# Patient Record
Sex: Male | Born: 1940 | Race: Black or African American | Hispanic: No | Marital: Single | Smoking: Never smoker
Health system: Southern US, Community
[De-identification: ages and names within clinical notes are randomized; demographics above are authoritative.]

## PROBLEM LIST (undated history)

## (undated) DIAGNOSIS — J209 Acute bronchitis, unspecified: Secondary | ICD-10-CM

## (undated) DIAGNOSIS — F411 Generalized anxiety disorder: Secondary | ICD-10-CM

## (undated) DIAGNOSIS — K219 Gastro-esophageal reflux disease without esophagitis: Secondary | ICD-10-CM

## (undated) DIAGNOSIS — M545 Low back pain, unspecified: Secondary | ICD-10-CM

## (undated) DIAGNOSIS — D126 Benign neoplasm of colon, unspecified: Secondary | ICD-10-CM

## (undated) DIAGNOSIS — I491 Atrial premature depolarization: Secondary | ICD-10-CM

## (undated) DIAGNOSIS — Z8669 Personal history of other diseases of the nervous system and sense organs: Secondary | ICD-10-CM

## (undated) DIAGNOSIS — N281 Cyst of kidney, acquired: Secondary | ICD-10-CM

## (undated) DIAGNOSIS — G4733 Obstructive sleep apnea (adult) (pediatric): Secondary | ICD-10-CM

## (undated) DIAGNOSIS — C61 Malignant neoplasm of prostate: Secondary | ICD-10-CM

## (undated) DIAGNOSIS — R413 Other amnesia: Secondary | ICD-10-CM

## (undated) DIAGNOSIS — E785 Hyperlipidemia, unspecified: Secondary | ICD-10-CM

## (undated) DIAGNOSIS — K5730 Diverticulosis of large intestine without perforation or abscess without bleeding: Secondary | ICD-10-CM

## (undated) DIAGNOSIS — K589 Irritable bowel syndrome without diarrhea: Secondary | ICD-10-CM

## (undated) DIAGNOSIS — M1990 Unspecified osteoarthritis, unspecified site: Secondary | ICD-10-CM

## (undated) HISTORY — DX: Cyst of kidney, acquired: N28.1

## (undated) HISTORY — DX: Benign neoplasm of colon, unspecified: D12.6

## (undated) HISTORY — DX: Diverticulosis of large intestine without perforation or abscess without bleeding: K57.30

## (undated) HISTORY — DX: Personal history of other diseases of the nervous system and sense organs: Z86.69

## (undated) HISTORY — DX: Acute bronchitis, unspecified: J20.9

## (undated) HISTORY — DX: Irritable bowel syndrome, unspecified: K58.9

## (undated) HISTORY — DX: Unspecified osteoarthritis, unspecified site: M19.90

## (undated) HISTORY — DX: Malignant neoplasm of prostate: C61

## (undated) HISTORY — DX: Obstructive sleep apnea (adult) (pediatric): G47.33

## (undated) HISTORY — DX: Hyperlipidemia, unspecified: E78.5

## (undated) HISTORY — DX: Low back pain: M54.5

## (undated) HISTORY — DX: Generalized anxiety disorder: F41.1

## (undated) HISTORY — DX: Other amnesia: R41.3

## (undated) HISTORY — DX: Low back pain, unspecified: M54.50

## (undated) HISTORY — DX: Atrial premature depolarization: I49.1

## (undated) HISTORY — PX: OTHER SURGICAL HISTORY: SHX169

## (undated) HISTORY — DX: Gastro-esophageal reflux disease without esophagitis: K21.9

---

## 1983-07-12 ENCOUNTER — Encounter (INDEPENDENT_AMBULATORY_CARE_PROVIDER_SITE_OTHER): Payer: Self-pay | Admitting: *Deleted

## 1984-04-17 HISTORY — PX: INNER EAR SURGERY: SHX679

## 1992-10-29 ENCOUNTER — Encounter (INDEPENDENT_AMBULATORY_CARE_PROVIDER_SITE_OTHER): Payer: Self-pay | Admitting: *Deleted

## 1998-04-08 ENCOUNTER — Encounter (INDEPENDENT_AMBULATORY_CARE_PROVIDER_SITE_OTHER): Payer: Self-pay | Admitting: *Deleted

## 1998-10-26 ENCOUNTER — Ambulatory Visit: Admission: RE | Admit: 1998-10-26 | Discharge: 1998-10-26 | Payer: Self-pay | Admitting: Otolaryngology

## 1999-04-18 HISTORY — PX: OTHER SURGICAL HISTORY: SHX169

## 2000-04-30 ENCOUNTER — Observation Stay (HOSPITAL_COMMUNITY): Admission: RE | Admit: 2000-04-30 | Discharge: 2000-05-01 | Payer: Self-pay | Admitting: Orthopedic Surgery

## 2000-07-03 ENCOUNTER — Ambulatory Visit (HOSPITAL_COMMUNITY): Admission: RE | Admit: 2000-07-03 | Discharge: 2000-07-03 | Payer: Self-pay | Admitting: Gastroenterology

## 2000-07-03 ENCOUNTER — Encounter: Payer: Self-pay | Admitting: Gastroenterology

## 2001-05-31 ENCOUNTER — Ambulatory Visit (HOSPITAL_BASED_OUTPATIENT_CLINIC_OR_DEPARTMENT_OTHER): Admission: RE | Admit: 2001-05-31 | Discharge: 2001-05-31 | Payer: Self-pay | Admitting: Pulmonary Disease

## 2001-09-06 ENCOUNTER — Encounter: Payer: Self-pay | Admitting: Pulmonary Disease

## 2002-07-28 ENCOUNTER — Encounter: Payer: Self-pay | Admitting: Gastroenterology

## 2002-08-14 ENCOUNTER — Encounter (INDEPENDENT_AMBULATORY_CARE_PROVIDER_SITE_OTHER): Payer: Self-pay | Admitting: *Deleted

## 2004-07-19 ENCOUNTER — Ambulatory Visit: Payer: Self-pay | Admitting: Pulmonary Disease

## 2005-01-30 ENCOUNTER — Ambulatory Visit: Payer: Self-pay | Admitting: Pulmonary Disease

## 2005-02-01 ENCOUNTER — Ambulatory Visit: Payer: Self-pay | Admitting: Cardiovascular Disease

## 2005-02-23 ENCOUNTER — Ambulatory Visit: Payer: Self-pay | Admitting: Pulmonary Disease

## 2005-08-01 ENCOUNTER — Ambulatory Visit: Payer: Self-pay | Admitting: Pulmonary Disease

## 2005-08-17 ENCOUNTER — Ambulatory Visit: Payer: Self-pay | Admitting: Pulmonary Disease

## 2005-12-06 ENCOUNTER — Ambulatory Visit: Payer: Self-pay | Admitting: Pulmonary Disease

## 2006-03-14 ENCOUNTER — Ambulatory Visit: Payer: Self-pay | Admitting: Pulmonary Disease

## 2006-03-19 ENCOUNTER — Ambulatory Visit: Payer: Self-pay | Admitting: Pulmonary Disease

## 2006-03-19 LAB — CONVERTED CEMR LAB
ALT: 25 units/L (ref 0–40)
AST: 29 units/L (ref 0–37)
Albumin: 4.4 g/dL (ref 3.5–5.2)
Alkaline Phosphatase: 76 units/L (ref 39–117)
BUN: 10 mg/dL (ref 6–23)
Basophils Absolute: 0 10*3/uL (ref 0.0–0.1)
Basophils Relative: 0.4 % (ref 0.0–1.0)
CO2: 32 meq/L (ref 19–32)
Calcium: 9 mg/dL (ref 8.4–10.5)
Chloride: 104 meq/L (ref 96–112)
Chol/HDL Ratio, serum: 5.1
Cholesterol: 214 mg/dL (ref 0–200)
Creatinine, Ser: 1.1 mg/dL (ref 0.4–1.5)
Eosinophil percent: 2.7 % (ref 0.0–5.0)
GFR calc non Af Amer: 71 mL/min
Glomerular Filtration Rate, Af Am: 86 mL/min/{1.73_m2}
Glucose, Bld: 95 mg/dL (ref 70–99)
HCT: 47.7 % (ref 39.0–52.0)
HDL: 42.3 mg/dL (ref >39.0)
Hemoglobin: 16 g/dL (ref 13.0–17.0)
LDL DIRECT: 169.6 mg/dL
Lymphocytes Relative: 34.2 % (ref 12.0–46.0)
MCHC: 33.5 g/dL (ref 30.0–36.0)
MCV: 89.6 fL (ref 78.0–100.0)
Monocytes Absolute: 0.7 10*3/uL (ref 0.2–0.7)
Monocytes Relative: 8.3 % (ref 3.0–11.0)
Neutro Abs: 5 10*3/uL (ref 1.4–7.7)
Neutrophils Relative %: 54.4 % (ref 43.0–77.0)
PSA: 2.17 ng/mL (ref 0.10–4.00)
Platelets: 249 10*3/uL (ref 150–400)
Potassium: 3.5 meq/L (ref 3.5–5.1)
RBC: 5.33 M/uL (ref 4.22–5.81)
RDW: 13.1 % (ref 11.5–14.6)
Sodium: 143 meq/L (ref 135–145)
TSH: 1.22 microintl units/mL (ref 0.35–5.50)
Total Bilirubin: 1 mg/dL (ref 0.3–1.2)
Total Protein: 7.3 g/dL (ref 6.0–8.3)
Triglyceride fasting, serum: 72 mg/dL (ref 0–149)
VLDL: 14 mg/dL (ref 0–40)
WBC: 9 10*3/uL (ref 4.5–10.5)

## 2006-06-25 ENCOUNTER — Ambulatory Visit: Payer: Self-pay | Admitting: Pulmonary Disease

## 2006-10-01 ENCOUNTER — Ambulatory Visit: Payer: Self-pay | Admitting: Pulmonary Disease

## 2007-01-29 ENCOUNTER — Ambulatory Visit: Payer: Self-pay | Admitting: Pulmonary Disease

## 2007-01-30 DIAGNOSIS — K5730 Diverticulosis of large intestine without perforation or abscess without bleeding: Secondary | ICD-10-CM | POA: Insufficient documentation

## 2007-01-30 DIAGNOSIS — K219 Gastro-esophageal reflux disease without esophagitis: Secondary | ICD-10-CM | POA: Insufficient documentation

## 2007-01-30 DIAGNOSIS — F411 Generalized anxiety disorder: Secondary | ICD-10-CM | POA: Insufficient documentation

## 2007-01-30 DIAGNOSIS — E785 Hyperlipidemia, unspecified: Secondary | ICD-10-CM | POA: Insufficient documentation

## 2007-01-30 DIAGNOSIS — G4733 Obstructive sleep apnea (adult) (pediatric): Secondary | ICD-10-CM

## 2007-01-30 DIAGNOSIS — I491 Atrial premature depolarization: Secondary | ICD-10-CM

## 2007-01-30 DIAGNOSIS — Z8669 Personal history of other diseases of the nervous system and sense organs: Secondary | ICD-10-CM | POA: Insufficient documentation

## 2007-01-30 DIAGNOSIS — J209 Acute bronchitis, unspecified: Secondary | ICD-10-CM

## 2007-01-30 DIAGNOSIS — M545 Low back pain: Secondary | ICD-10-CM

## 2007-01-30 DIAGNOSIS — K589 Irritable bowel syndrome without diarrhea: Secondary | ICD-10-CM

## 2007-02-20 ENCOUNTER — Ambulatory Visit: Payer: Self-pay | Admitting: Cardiology

## 2007-03-21 ENCOUNTER — Encounter: Payer: Self-pay | Admitting: Pulmonary Disease

## 2007-03-21 DIAGNOSIS — M1990 Unspecified osteoarthritis, unspecified site: Secondary | ICD-10-CM | POA: Insufficient documentation

## 2007-03-21 DIAGNOSIS — N281 Cyst of kidney, acquired: Secondary | ICD-10-CM | POA: Insufficient documentation

## 2007-04-03 ENCOUNTER — Telehealth (INDEPENDENT_AMBULATORY_CARE_PROVIDER_SITE_OTHER): Payer: Self-pay | Admitting: *Deleted

## 2007-05-17 ENCOUNTER — Encounter: Payer: Self-pay | Admitting: Pulmonary Disease

## 2007-05-23 ENCOUNTER — Emergency Department (HOSPITAL_COMMUNITY): Admission: EM | Admit: 2007-05-23 | Discharge: 2007-05-23 | Payer: Self-pay | Admitting: Emergency Medicine

## 2007-11-11 ENCOUNTER — Ambulatory Visit: Payer: Self-pay | Admitting: Pulmonary Disease

## 2007-11-13 DIAGNOSIS — R413 Other amnesia: Secondary | ICD-10-CM

## 2007-11-13 LAB — CONVERTED CEMR LAB
ALT: 21 units/L (ref 0–53)
AST: 24 units/L (ref 0–37)
Albumin: 4.3 g/dL (ref 3.5–5.2)
Alkaline Phosphatase: 70 units/L (ref 39–117)
BUN: 15 mg/dL (ref 6–23)
Bacteria, UA: NEGATIVE
Basophils Absolute: 0 10*3/uL (ref 0.0–0.1)
Basophils Relative: 0.2 % (ref 0.0–3.0)
Bilirubin Urine: NEGATIVE
Bilirubin, Direct: 0.1 mg/dL (ref 0.0–0.3)
CO2: 32 meq/L (ref 19–32)
Calcium: 9 mg/dL (ref 8.4–10.5)
Chloride: 106 meq/L (ref 96–112)
Cholesterol: 192 mg/dL (ref 0–200)
Creatinine, Ser: 1 mg/dL (ref 0.4–1.5)
Crystals: NEGATIVE
Eosinophils Absolute: 0.1 10*3/uL (ref 0.0–0.7)
Eosinophils Relative: 1.5 % (ref 0.0–5.0)
GFR calc Af Amer: 96 mL/min
GFR calc non Af Amer: 79 mL/min
Glucose, Bld: 88 mg/dL (ref 70–99)
HCT: 44.7 % (ref 39.0–52.0)
HDL: 36.1 mg/dL — ABNORMAL LOW (ref >39.0)
Hemoglobin, Urine: NEGATIVE
Hemoglobin: 15.7 g/dL (ref 13.0–17.0)
Ketones, ur: NEGATIVE mg/dL
LDL Cholesterol: 134 mg/dL — ABNORMAL HIGH (ref 0–99)
Leukocytes, UA: NEGATIVE
Lymphocytes Relative: 35.8 % (ref 12.0–46.0)
MCHC: 35.1 g/dL (ref 30.0–36.0)
MCV: 90.5 fL (ref 78.0–100.0)
Monocytes Absolute: 0.7 10*3/uL (ref 0.1–1.0)
Monocytes Relative: 9.1 % (ref 3.0–12.0)
Mucus, UA: NEGATIVE
Neutro Abs: 4.2 10*3/uL (ref 1.4–7.7)
Neutrophils Relative %: 53.4 % (ref 43.0–77.0)
Nitrite: NEGATIVE
PSA: 2.59 ng/mL (ref 0.10–4.00)
Platelets: 235 10*3/uL (ref 150–400)
Potassium: 3.6 meq/L (ref 3.5–5.1)
RBC: 4.94 M/uL (ref 4.22–5.81)
RDW: 13 % (ref 11.5–14.6)
Sodium: 144 meq/L (ref 135–145)
Specific Gravity, Urine: 1.02 (ref 1.000–1.03)
TSH: 1.34 microintl units/mL (ref 0.35–5.50)
Total Bilirubin: 1 mg/dL (ref 0.3–1.2)
Total CHOL/HDL Ratio: 5.3
Total Protein, Urine: NEGATIVE mg/dL
Total Protein: 7.2 g/dL (ref 6.0–8.3)
Triglycerides: 110 mg/dL (ref 0–149)
Urine Glucose: NEGATIVE mg/dL
Urobilinogen, UA: 1 (ref 0.0–1.0)
VLDL: 22 mg/dL (ref 0–40)
WBC: 7.8 10*3/uL (ref 4.5–10.5)
pH: 6.5 (ref 5.0–8.0)

## 2008-01-31 ENCOUNTER — Telehealth: Payer: Self-pay | Admitting: Pulmonary Disease

## 2008-01-31 DIAGNOSIS — R079 Chest pain, unspecified: Secondary | ICD-10-CM | POA: Insufficient documentation

## 2008-02-04 ENCOUNTER — Encounter: Payer: Self-pay | Admitting: Pulmonary Disease

## 2008-02-10 ENCOUNTER — Encounter: Payer: Self-pay | Admitting: Pulmonary Disease

## 2008-02-10 ENCOUNTER — Ambulatory Visit: Payer: Self-pay

## 2008-04-06 ENCOUNTER — Telehealth (INDEPENDENT_AMBULATORY_CARE_PROVIDER_SITE_OTHER): Payer: Self-pay | Admitting: *Deleted

## 2008-04-08 ENCOUNTER — Ambulatory Visit: Payer: Self-pay | Admitting: Pulmonary Disease

## 2008-04-08 DIAGNOSIS — M79609 Pain in unspecified limb: Secondary | ICD-10-CM

## 2008-04-14 ENCOUNTER — Ambulatory Visit: Payer: Self-pay

## 2008-04-14 ENCOUNTER — Encounter: Payer: Self-pay | Admitting: Pulmonary Disease

## 2008-04-16 ENCOUNTER — Encounter: Payer: Self-pay | Admitting: Pulmonary Disease

## 2008-04-16 ENCOUNTER — Ambulatory Visit: Payer: Self-pay

## 2008-11-27 ENCOUNTER — Ambulatory Visit: Payer: Self-pay | Admitting: Pulmonary Disease

## 2008-11-27 DIAGNOSIS — L723 Sebaceous cyst: Secondary | ICD-10-CM

## 2008-11-30 ENCOUNTER — Ambulatory Visit: Payer: Self-pay | Admitting: Adult Health

## 2008-12-01 LAB — CONVERTED CEMR LAB
ALT: 28 units/L (ref 0–53)
AST: 30 units/L (ref 0–37)
Albumin: 4.4 g/dL (ref 3.5–5.2)
Alkaline Phosphatase: 82 units/L (ref 39–117)
BUN: 12 mg/dL (ref 6–23)
Basophils Absolute: 0 10*3/uL (ref 0.0–0.1)
Basophils Relative: 0.3 % (ref 0.0–3.0)
Bilirubin, Direct: 0.1 mg/dL (ref 0.0–0.3)
CO2: 33 meq/L — ABNORMAL HIGH (ref 19–32)
Calcium: 9.2 mg/dL (ref 8.4–10.5)
Chloride: 107 meq/L (ref 96–112)
Cholesterol: 224 mg/dL — ABNORMAL HIGH (ref 0–200)
Creatinine, Ser: 1 mg/dL (ref 0.4–1.5)
Direct LDL: 160.7 mg/dL
Eosinophils Absolute: 0.2 10*3/uL (ref 0.0–0.7)
Eosinophils Relative: 2.3 % (ref 0.0–5.0)
GFR calc non Af Amer: 95.65 mL/min (ref >60)
Glucose, Bld: 95 mg/dL (ref 70–99)
HCT: 47.2 % (ref 39.0–52.0)
HDL: 42.7 mg/dL (ref >39.00)
Hemoglobin: 15.9 g/dL (ref 13.0–17.0)
Lymphocytes Relative: 37.4 % (ref 12.0–46.0)
Lymphs Abs: 3 10*3/uL (ref 0.7–4.0)
MCHC: 33.8 g/dL (ref 30.0–36.0)
MCV: 89.6 fL (ref 78.0–100.0)
Monocytes Absolute: 0.8 10*3/uL (ref 0.1–1.0)
Monocytes Relative: 9.3 % (ref 3.0–12.0)
Neutro Abs: 4.1 10*3/uL (ref 1.4–7.7)
Neutrophils Relative %: 50.7 % (ref 43.0–77.0)
PSA: 3.05 ng/mL (ref 0.10–4.00)
Platelets: 239 10*3/uL (ref 150.0–400.0)
Potassium: 3.8 meq/L (ref 3.5–5.1)
RBC: 5.26 M/uL (ref 4.22–5.81)
RDW: 13.1 % (ref 11.5–14.6)
Sodium: 142 meq/L (ref 135–145)
TSH: 0.72 microintl units/mL (ref 0.35–5.50)
Total Bilirubin: 1.1 mg/dL (ref 0.3–1.2)
Total CHOL/HDL Ratio: 5
Total Protein: 7.9 g/dL (ref 6.0–8.3)
Triglycerides: 149 mg/dL (ref 0.0–149.0)
VLDL: 29.8 mg/dL (ref 0.0–40.0)
WBC: 8.1 10*3/uL (ref 4.5–10.5)

## 2009-01-05 ENCOUNTER — Encounter: Payer: Self-pay | Admitting: Pulmonary Disease

## 2009-01-22 ENCOUNTER — Encounter: Payer: Self-pay | Admitting: Pulmonary Disease

## 2009-02-10 ENCOUNTER — Ambulatory Visit: Payer: Self-pay | Admitting: Pulmonary Disease

## 2009-02-10 ENCOUNTER — Telehealth: Payer: Self-pay | Admitting: Pulmonary Disease

## 2009-02-12 ENCOUNTER — Telehealth: Payer: Self-pay | Admitting: Pulmonary Disease

## 2009-02-12 LAB — CONVERTED CEMR LAB
Cholesterol: 224 mg/dL — ABNORMAL HIGH (ref 0–200)
Direct LDL: 176.1 mg/dL
HDL: 39.2 mg/dL (ref >39.00)
Total CHOL/HDL Ratio: 6
Triglycerides: 98 mg/dL (ref 0.0–149.0)
VLDL: 19.6 mg/dL (ref 0.0–40.0)

## 2009-03-22 ENCOUNTER — Encounter: Payer: Self-pay | Admitting: Pulmonary Disease

## 2009-04-23 ENCOUNTER — Encounter: Payer: Self-pay | Admitting: Pulmonary Disease

## 2009-05-13 ENCOUNTER — Encounter: Payer: Self-pay | Admitting: Pulmonary Disease

## 2009-05-26 ENCOUNTER — Ambulatory Visit: Admission: RE | Admit: 2009-05-26 | Discharge: 2009-08-24 | Payer: Self-pay | Admitting: Radiation Oncology

## 2009-05-27 ENCOUNTER — Encounter: Payer: Self-pay | Admitting: Pulmonary Disease

## 2009-06-01 ENCOUNTER — Encounter: Payer: Self-pay | Admitting: Pulmonary Disease

## 2009-06-24 ENCOUNTER — Encounter (INDEPENDENT_AMBULATORY_CARE_PROVIDER_SITE_OTHER): Payer: Self-pay | Admitting: *Deleted

## 2009-08-02 ENCOUNTER — Telehealth: Payer: Self-pay | Admitting: Gastroenterology

## 2009-08-03 ENCOUNTER — Encounter (INDEPENDENT_AMBULATORY_CARE_PROVIDER_SITE_OTHER): Payer: Self-pay | Admitting: *Deleted

## 2009-08-10 ENCOUNTER — Ambulatory Visit: Payer: Self-pay | Admitting: Pulmonary Disease

## 2009-08-10 DIAGNOSIS — C61 Malignant neoplasm of prostate: Secondary | ICD-10-CM

## 2009-08-12 ENCOUNTER — Telehealth (INDEPENDENT_AMBULATORY_CARE_PROVIDER_SITE_OTHER): Payer: Self-pay | Admitting: *Deleted

## 2009-08-14 LAB — CONVERTED CEMR LAB
Alkaline Phosphatase: 81 units/L (ref 39–117)
Basophils Relative: 0.2 % (ref 0.0–3.0)
Bilirubin, Direct: 0.1 mg/dL (ref 0.0–0.3)
CO2: 32 meq/L (ref 19–32)
Calcium: 9.6 mg/dL (ref 8.4–10.5)
Creatinine, Ser: 1.1 mg/dL (ref 0.4–1.5)
Eosinophils Absolute: 0.1 10*3/uL (ref 0.0–0.7)
GFR calc non Af Amer: 85.51 mL/min (ref >60)
HCT: 44.4 % (ref 39.0–52.0)
HDL: 41.8 mg/dL (ref >39.00)
Hemoglobin: 15.4 g/dL (ref 13.0–17.0)
Lymphocytes Relative: 20.1 % (ref 12.0–46.0)
Lymphs Abs: 1.4 10*3/uL (ref 0.7–4.0)
MCHC: 34.6 g/dL (ref 30.0–36.0)
Neutro Abs: 4.9 10*3/uL (ref 1.4–7.7)
RBC: 4.95 M/uL (ref 4.22–5.81)
Sodium: 146 meq/L — ABNORMAL HIGH (ref 135–145)
TSH: 0.79 microintl units/mL (ref 0.35–5.50)
Total Bilirubin: 1 mg/dL (ref 0.3–1.2)
Total CHOL/HDL Ratio: 5
Total Protein: 7.3 g/dL (ref 6.0–8.3)
VLDL: 20.8 mg/dL (ref 0.0–40.0)

## 2009-08-24 ENCOUNTER — Ambulatory Visit: Admission: RE | Admit: 2009-08-24 | Discharge: 2009-09-22 | Payer: Self-pay | Admitting: Radiation Oncology

## 2009-09-22 ENCOUNTER — Encounter (INDEPENDENT_AMBULATORY_CARE_PROVIDER_SITE_OTHER): Payer: Self-pay | Admitting: *Deleted

## 2009-09-22 ENCOUNTER — Encounter: Payer: Self-pay | Admitting: Pulmonary Disease

## 2009-10-12 ENCOUNTER — Encounter (INDEPENDENT_AMBULATORY_CARE_PROVIDER_SITE_OTHER): Payer: Self-pay | Admitting: *Deleted

## 2009-10-13 ENCOUNTER — Ambulatory Visit: Payer: Self-pay | Admitting: Gastroenterology

## 2009-10-28 ENCOUNTER — Ambulatory Visit: Payer: Self-pay | Admitting: Gastroenterology

## 2009-11-01 ENCOUNTER — Encounter: Payer: Self-pay | Admitting: Gastroenterology

## 2009-11-02 ENCOUNTER — Encounter: Payer: Self-pay | Admitting: Pulmonary Disease

## 2009-12-01 ENCOUNTER — Encounter: Payer: Self-pay | Admitting: Pulmonary Disease

## 2010-02-07 ENCOUNTER — Ambulatory Visit: Payer: Self-pay | Admitting: Pulmonary Disease

## 2010-02-07 DIAGNOSIS — D126 Benign neoplasm of colon, unspecified: Secondary | ICD-10-CM

## 2010-02-11 ENCOUNTER — Ambulatory Visit: Payer: Self-pay | Admitting: Pulmonary Disease

## 2010-02-18 LAB — CONVERTED CEMR LAB
Hgb A1c MFr Bld: 5.7 % (ref 4.6–6.5)
Ketones, ur: NEGATIVE mg/dL
Urine Glucose: NEGATIVE mg/dL
Urobilinogen, UA: 1 (ref 0.0–1.0)

## 2010-03-01 ENCOUNTER — Encounter: Payer: Self-pay | Admitting: Pulmonary Disease

## 2010-03-07 LAB — CONVERTED CEMR LAB
Alkaline Phosphatase: 82 units/L (ref 39–117)
Bilirubin, Direct: 0.2 mg/dL (ref 0.0–0.3)
LDL Cholesterol: 78 mg/dL (ref 0–99)
Total Bilirubin: 1.4 mg/dL — ABNORMAL HIGH (ref 0.3–1.2)
Total CHOL/HDL Ratio: 3

## 2010-04-06 ENCOUNTER — Telehealth (INDEPENDENT_AMBULATORY_CARE_PROVIDER_SITE_OTHER): Payer: Self-pay | Admitting: *Deleted

## 2010-04-07 ENCOUNTER — Ambulatory Visit: Payer: Self-pay | Admitting: Adult Health

## 2010-04-07 DIAGNOSIS — R42 Dizziness and giddiness: Secondary | ICD-10-CM

## 2010-04-07 LAB — CONVERTED CEMR LAB
ALT: 33 units/L (ref 0–53)
Albumin: 4.3 g/dL (ref 3.5–5.2)
Alkaline Phosphatase: 80 units/L (ref 39–117)
Bilirubin, Direct: 0.1 mg/dL (ref 0.0–0.3)
CO2: 29 meq/L (ref 19–32)
Calcium: 9.2 mg/dL (ref 8.4–10.5)
Chloride: 102 meq/L (ref 96–112)
Glucose, Bld: 74 mg/dL (ref 70–99)
Sodium: 139 meq/L (ref 135–145)
Total Protein: 7 g/dL (ref 6.0–8.3)

## 2010-05-13 ENCOUNTER — Encounter: Payer: Self-pay | Admitting: Pulmonary Disease

## 2010-05-17 NOTE — Letter (Signed)
[facility]   Imported By: Sherian Rein 11/29/2009 11:54:54  _____________________________________________________________________  External Attachment:    Type:   Image     Comment:   External Document

## 2010-05-17 NOTE — Procedures (Signed)
Summary: EGD   EGD  Procedure date:  08/14/2002  Findings:      Findings: Normal  Location: [facility]   Patient Name: Brett Terrell, Brett Terrell MRN:  Procedure Procedures: Panendoscopy (EGD) CPT: 43235.  Personnel: Endoscopist: Ulyess Mort, MD.  Exam Location: Exam performed in Outpatient Clinic. Outpatient  Patient Consent: Procedure, Alternatives, Risks and Benefits discussed, consent obtained, from patient. Consent was obtained by the RN.  Indications Symptoms: Abdominal pain, location: LUQ.  History  Pre-Exam Physical: Performed Aug 14, 2002  Cardio-pulmonary exam, HEENT exam, Abdominal exam, Extremity exam, Mental status exam WNL.  Exam Exam Info: Maximum depth of insertion Duodenum, intended Duodenum. Vocal cords visualized. Gastric retroflexion was not performed. Images were not taken. ASA Classification: II. Tolerance: good.  Sedation Meds: Patient assessed and found to be appropriate for moderate (conscious) sedation. Fentanyl 50 mcg. given IV. Versed 7 mg. given IV. Cetacaine Spray 1 sprays given aerosolized.  Monitoring: BP and pulse monitoring done. Oximetry used. Supplemental O2 given  Findings - Normal: Proximal Esophagus to Distal Esophagus.  - Normal: Pyloric Sphincter to Jejunum.  - MUCOSAL ABNORMALITY: Body to Antrum. Granular mucosa.   Assessment Normal examination.  Events  Unplanned Intervention: No unplanned interventions were required.  Unplanned Events: There were no complications. Plans Medication(s): Continue current medications.  Patient Education: Patient given standard instructions for: Mucosal Abnormality. a normal exam. minn.gastritis.  Disposition: After procedure patient sent to recovery. After recovery patient sent home.  This report was created from the original endoscopy report, which was reviewed and signed by the above listed endoscopist.    cc: Lonzo Cloud. Kriste Basque, MD

## 2010-05-17 NOTE — Letter (Signed)
Summary: Previsit letter  [facility]  [address] [place], [place] [address]   Phone: [phone]  Fax: [phone]       09/22/2009 MRN: [id number]  [facility] [address] [place], [place]  [address]  Dear Mr. Creekmore,  Welcome to the Gastroenterology Division at [facility].    You are scheduled to see a nurse for your pre-procedure visit on 10/13/2009 at 10:30AM on the 3rd floor at [facility], [address]  We ask that you try to arrive at our office 15 minutes prior to your appointment time to allow for check-in.  Your nurse visit will consist of discussing your medical and surgical history, your immediate family medical history, and your medications.    Please bring a complete list of all your medications or, if you prefer, bring the medication bottles and we will list them.  We will need to be aware of both prescribed and over the counter drugs.  We will need to know exact dosage information as well.  If you are on blood thinners (Coumadin, Plavix, Aggrenox, Ticlid, etc.) please call our office today/prior to your appointment, as we need to consult with your physician about holding your medication.   Please be prepared to read and sign documents such as consent forms, a financial agreement, and acknowledgement forms.  If necessary, and with your consent, a friend or relative is welcome to sit-in on the nurse visit with you.  Please bring your insurance card so that we may make a copy of it.  If your insurance requires a referral to see a specialist, please bring your referral form from your primary care physician.  No co-pay is required for this nurse visit.     If you cannot keep your appointment, please call [phone] to cancel or reschedule prior to your appointment date.  This allows [place] the opportunity to schedule an appointment for another patient in need of care.    Thank you for choosing [facility] for your medical  needs.  We appreciate the opportunity to care for you.  Please visit [place] at our website  to learn more about our practice.                     Sincerely.                                                                                                                   The Gastroenterology Division

## 2010-05-17 NOTE — Procedures (Signed)
Summary: [facility]  [facility]   Imported By: Sherian Rein 08/25/2009 14:59:44  _____________________________________________________________________  External Attachment:    Type:   Image     Comment:   External Document

## 2010-05-17 NOTE — Letter (Signed)
Summary: Colonoscopy Letter  [facility]  [address] [place], [place] [address]   Phone: [phone]  Fax: [phone]      June 24, 2009 MRN: [id number]   [facility] [address] [place], [place]  [address]   Dear Mr. Bywater,   According to your medical record, it is time for you to schedule a Colonoscopy. The American Cancer Society recommends this procedure as a method to detect early colon cancer. Patients with a family history of colon cancer, or a personal history of colon polyps or inflammatory bowel disease are at increased risk.  This letter has beeen generated based on the recommendations made at the time of your procedure. If you feel that in your particular situation this may no longer apply, please contact our office.  Please call our office at [phone] to schedule this appointment or to update your records at your earliest convenience.  Thank you for cooperating with [place] to provide you with the very best care possible.   Sincerely,  Judie Petit T. Russella Dar, M.D.  [facility] [phone]

## 2010-05-17 NOTE — Letter (Signed)
[facility]   Imported By: Sherian Rein 06/24/2009 15:14:12  _____________________________________________________________________  External Attachment:    Type:   Image     Comment:   External Document

## 2010-05-17 NOTE — Letter (Signed)
Summary: [facility]  [facility]   Imported By: Lester Sitka 06/25/2009 10:13:35  _____________________________________________________________________  External Attachment:    Type:   Image     Comment:   External Document

## 2010-05-17 NOTE — Procedures (Signed)
Summary: Colonoscopy  Patient: Brett Terrell Note: All result statuses are Final unless otherwise noted.  Tests: (1) Colonoscopy (COL)   COL Colonoscopy           DONE     [facility]     [address]     [place], [place]  [address]           COLONOSCOPY PROCEDURE REPORT     PATIENT:  Brett Terrell, Brett Terrell  MR#:  [id number]     BIRTHDATE:  [birth year], 68 yrs. old  GENDER:  male     ENDOSCOPIST:  Judie Petit T. Russella Dar, MD, [facility]           PROCEDURE DATE:  10/28/2009     PROCEDURE:  Colonoscopy with biopsy     ASA CLASS:  Class II     INDICATIONS:  1) surveillance and high-risk screening  2)     follow-up of polyp, type unknown, 1994. History of prostate     cancer.     MEDICATIONS:   Fentanyl 50 mcg IV, Versed 6 mg IV     DESCRIPTION OF PROCEDURE:   After the risks benefits and     alternatives of the procedure were thoroughly explained, informed     consent was obtained.  Digital rectal exam was performed and     revealed no abnormalities.   The LB [id number]-[id number] endoscope     was introduced through the anus and advanced to the cecum, which     was identified by both the appendix and ileocecal valve, without     limitations.  The quality of the prep was good, using MoviPrep.     The instrument was then slowly withdrawn as the colon was fully     examined.     <<PROCEDUREIMAGES>>     FINDINGS:  A sessile polyp was found in the ascending colon. It     was 3 mm in size. The polyp was removed using cold biopsy forceps.     Two polyps were found in the transverse colon. They were 3 mm in     size. The polyps were removed using cold biopsy forceps.     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 It was moderately     severe. This was otherwise a normal examination of the colon.     Retroflexed views in the rectum revealed internal hemorrhoids,     small. The time to cecum = 3.25  minutes. The scope was then     withdrawn (time = 9.67  min) from the patient and the procedure  completed.           COMPLICATIONS:  None           ENDOSCOPIC IMPRESSION:     1) 3 mm sessile polyp in the ascending colon     2) 3 mm, two polyps in the transverse colon     3) Melanosis throughout the colon     4) Internal hemorrhoids           RECOMMENDATIONS:     1) Await pathology results     2) High fiber diet with liberal fluid intake.     3) If the polyps removed today are adenomatous (pre-cancerous) ,     you will need a repeat colonoscopy in 5 years. Otherwise you     should continue to follow colorectal cancer screening guidelines     for "routine risk" patients with colonoscopy in 10 years.4) Avoid  stimulant laxatives           Samiksha Pellicano T. Russella Dar, MD, [facility]           CC: Michele Mcalpine, MD           n.     Rosalie DoctorVenita Lick. Jaydence Vanyo at 10/28/2009 12:23 PM           Benjaman Lobe, [id number]  Note: An exclamation mark (!) indicates a result that was not dispersed into the flowsheet. Document Creation Date: 10/28/2009 12:24 PM _______________________________________________________________________  (1) Order result status: Final Collection or observation date-time: 10/28/2009 12:14 Requested date-time:  Receipt date-time:  Reported date-time:  Referring Physician:   Ordering Physician: Claudette Head [id number]) Specimen Source:  Source: Launa Grill Order Number: [id number] Lab site:   Appended Document: Colonoscopy     Procedures Next Due Date:    Colonoscopy: 10/2014

## 2010-05-17 NOTE — Letter (Signed)
Summary: Brett Terrell Instructions  Brett Terrell  [address] [place], [place] [address]   Phone: [phone]  Fax: [phone]       Brett Terrell    12-Mar-1941    MRN: [id number]        Procedure Day Brett Terrell:  Brett Terrell  10/28/09     Arrival Time:  10:30AM      Procedure Time:  11:30AM     Location of Procedure:                    _ X_  Brett Terrell (4th Floor)                       PREPARATION FOR COLONOSCOPY WITH MOVIPREP   Starting 5 days prior to your procedure 10/23/09 do not eat nuts, seeds, popcorn, corn, beans, peas,  salads, or any raw vegetables.  Do not take any fiber supplements (e.g. Metamucil, Citrucel, and Benefiber).  THE DAY BEFORE YOUR PROCEDURE         DATE: 10/27/09  DAY: WEDNESDAY  1.  Drink clear liquids the entire day-NO SOLID FOOD  2.  Do not drink anything colored red or purple.  Avoid juices with pulp.  No orange juice.  3.  Drink at least 64 oz. (8 glasses) of fluid/clear liquids during the day to prevent dehydration and help the prep work efficiently.  CLEAR LIQUIDS INCLUDE: Water Jello Ice Popsicles Tea (sugar ok, no milk/cream) Powdered fruit flavored drinks Coffee (sugar ok, no milk/cream) Gatorade Juice: apple, white grape, white cranberry  Lemonade Clear bullion, consomm, broth Carbonated beverages (any kind) Strained chicken noodle soup Hard Candy                             4.  In the morning, mix first dose of MoviPrep solution:    Empty 1 Pouch A and 1 Pouch B into the disposable container    Add lukewarm drinking water to the top line of the container. Mix to dissolve    Refrigerate (mixed solution should be used within 24 hrs)  5.  Begin drinking the prep at 5:00 p.m. The MoviPrep container is divided by 4 marks.   Every 15 minutes drink the solution down to the next mark (approximately 8 oz) until the full liter is complete.   6.  Follow completed prep with 16 oz of clear liquid of your choice  (Nothing red or purple).  Continue to drink clear liquids until bedtime.  7.  Before going to bed, mix second dose of MoviPrep solution:    Empty 1 Pouch A and 1 Pouch B into the disposable container    Add lukewarm drinking water to the top line of the container. Mix to dissolve    Refrigerate  THE DAY OF YOUR PROCEDURE      DATE: 10/28/09   DAY: THURSDAY  Beginning at 6:30AM (5 hours before procedure):         1. Every 15 minutes, drink the solution down to the next mark (approx 8 oz) until the full liter is complete.  2. Follow completed prep with 16 oz. of clear liquid of your choice.    3. You may drink clear liquids until 9:30AM (2 HOURS BEFORE PROCEDURE).   MEDICATION INSTRUCTIONS  Unless otherwise instructed, you should take regular prescription medications with a small sip of water   as early as possible the  morning of your procedure.        OTHER INSTRUCTIONS  You will need a responsible adult at least 70 years of age to accompany you and drive you home.   This person must remain in the waiting room during your procedure.  Wear loose fitting clothing that is easily removed.  Leave jewelry and other valuables at home.  However, you may wish to bring a book to read or  an iPod/MP3 player to listen to music as you wait for your procedure to start.  Remove all body piercing jewelry and leave at home.  Total time from sign-in until discharge is approximately 2-3 hours.  You should go home directly after your procedure and rest.  You can resume normal activities the  day after your procedure.  The day of your procedure you should not:   Drive   Make legal decisions   Operate machinery   Drink alcohol   Return to work  You will receive specific instructions about eating, activities and medications before you leave.    The above instructions have been reviewed and explained to me by   Brett Almas RN  October 13, 2009 11:10 AM     I fully understand  and can verbalize these instructions _____________________________ Date _________

## 2010-05-17 NOTE — Progress Notes (Signed)
Summary: stress test  Phone Note Call from Patient Call back at Home Phone [phone] Call back at cell [phone]   Caller: Patient Call For: Ocie Stanzione Reason for Call: Talk to Nurse Summary of Call: pt was told by sn to call in and we would set him up for a stress test.  Didn't see an order. Initial call taken by: Eugene Gavia,  January 31, 2008 1:35 PM  Follow-up for Phone Call        No record of order or mention of stress test.  Dr Kriste Basque do you want to order this for this pt?? Follow-up by: Cloyde Reams RN,  January 31, 2008 2:20 PM  Additional Follow-up for Phone Call Additional follow up Details #1::        Nuclear Stress Test scheduled for Monday Oct. 26 at 8:45 at [facility]. Pt to arrive at 8:30. Mailed copy of instructions to pt. LMOAM on pt's cell of above appt date, time and location. Advised pt that information to be sent in mail. If pt had any questions to please feel free to contact me at [phone]. Additional Follow-up by: Alfonso Ramus,  February 04, 2008 11:52 AM  New Problems: CHEST PAIN (ICD-786.50)   New Problems: CHEST PAIN (ICD-786.50)

## 2010-05-17 NOTE — Letter (Signed)
[facility]   Imported By: Sherian Rein 10/07/2009 10:46:08  _____________________________________________________________________  External Attachment:    Type:   Image     Comment:   External Document

## 2010-05-17 NOTE — Letter (Signed)
Summary: New Patient letter  [facility]  [address] [place], [place] [address]   Phone: [phone]  Fax: [phone]       08/03/2009 MRN: [id number]  [facility] [address] [place], [place]  [address]  Dear Brett Terrell,  Welcome to the Gastroenterology Division at [facility].    You are scheduled to see Dr.  Claudette Head onWednesday-May 02/2010 at 10:15 am on the 3rd floor at [facility], [address].  We ask that you try to arrive at our office 15 minutes prior to your appointment time to allow for check-in.  We would like you to complete the enclosed self-administered evaluation form prior to your visit and bring it with you on the day of your appointment.  We will review it with you.  Also, please bring a complete list of all your medications or, if you prefer, bring the medication bottles and we will list them.  Please bring your insurance card so that we may make a copy of it.  If your insurance requires a referral to see a specialist, please bring your referral form from your primary care physician.  Co-payments are due at the time of your visit and may be paid by cash, check or credit card.     Your office visit will consist of a consult with your physician (includes a physical exam), any laboratory testing he/she may order, scheduling of any necessary diagnostic testing (e.g. x-ray, ultrasound, CT-scan), and scheduling of a procedure (e.g. Endoscopy, Colonoscopy) if required.  Please allow enough time on your schedule to allow for any/all of these possibilities.    If you cannot keep your appointment, please call [phone] to cancel or reschedule prior to your appointment date.  This allows [place] the opportunity to schedule an appointment for another patient in need of care.  If you do not cancel or reschedule by 5 p.m. the business day prior to your appointment date, you will be charged a $50.00 late cancellation/no-show fee.    Thank  you for choosing [facility] for your medical needs.  We appreciate the opportunity to care for you.  Please visit [place] at our website  to learn more about our practice.                     Sincerely,                                                             The Gastroenterology Division

## 2010-05-17 NOTE — Miscellaneous (Signed)
Summary: [facility] Previsit/prep  Clinical Lists Changes  Medications: Added new medication of MOVIPREP 100 GM  SOLR (PEG-KCL-NACL-NASULF-NA ASC-C) As per prep instructions. - Signed Rx of MOVIPREP 100 GM  SOLR (PEG-KCL-NACL-NASULF-NA ASC-C) As per prep instructions.;  #1 x 0;  Signed;  Entered by: Wyona Almas RN;  Authorized by: Meryl Dare MD [facility];  Method used: Electronically to [facility] [phone]*, [address], [place], [place]  [address], Ph: [phone], Fax: [phone] Observations: Added new observation of NKA: T (10/13/2009 10:40)    Prescriptions: MOVIPREP 100 GM  SOLR (PEG-KCL-NACL-NASULF-NA ASC-C) As per prep instructions.  #1 x 0   Entered by:   Wyona Almas RN   Authorized by:   Meryl Dare MD [facility]   Signed by:   Wyona Almas RN on 10/13/2009   Method used:   Electronically to        [facility] [address] [phone]* (retail)       [address]       [place], [place]  [address]       Ph: [phone]       Fax: [phone]   RxID:   [id number]

## 2010-05-17 NOTE — Progress Notes (Signed)
Summary: speak ot nurse   Phone Note Call from Patient Call back at Home Phone [phone]   Caller: Patient Call For: Russella Dar Reason for Call: Talk to Nurse Summary of Call: Patient wants to speak to nurse regarding recall col. Initial call taken by: Tawni Levy,  August 02, 2009 9:15 AM  Follow-up for Phone Call        I attempted to return the call, no answer and no machine.  I will contintue to try and reach the patient. 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 and is undergoing radiation. Additional Follow-up by: Teryl Lucy RN,  August 03, 2009 3:36 PM

## 2010-05-17 NOTE — Assessment & Plan Note (Signed)
Summary: rov 6 months///kp   CC:  6 month ROV & review of mult medical problems....  History of Present Illness: 70 y/o BM here for a follow up visit... he has multiple medical problems as noted below...    ~  Jul09:  he has a hx of  multiple somatic complaints:  c/o small VV left leg- non-tender, no pain, he's considering vein clinic... rec: no salt, elevate, support;  c/o constipation and stools stick to the sides of the commode- discussed Miralax + Senakot-S;  c/o right hip pain on & off- he's seen DrAlusio in past;  c/o BB sized cyst under skin of right buttock... I still can't feel it- rec: excision if it bothers him;  c/o gas- discussed Mylicon, GasX, BeanO, Phazyme;  he wants Androderm patches- he's currently using "Estroblaster" supplement to prevent breast tissue developement!  ~  Dec09:  he had NuclearStressTest 02/10/08- negative w/o infarct or ischemia, EF= 54%... but the tech mentioned leg dragging to the pt & he is concerned "they said I should get my circulation tested"... no exercise pain, certainly no rest pain, sores, etc... he does have arthritis in knees w/ prev arthroscopies by DrSue & DrAlusio... plus a mod severe peripheral neuropathy (idiopathic) that is better w/ Neurontin 300mg - taking 2Tid... Art & Venous dopplers 12.09 were neg- WNL...   ~  February 10, 2009:  he has retired from [employer]... doing satis but concerned about his right hip discomfort, and something about a report from [facility] prostate screening clinic (he will bring the report for me to review >> PSA at [facility] 9/10 was 3.26 & we will refer to Urology for rising PSA ... his TChol & LDL are elevated & we started Simva Rx.   ~  August 10, 2009:  referred to DrBorden for Urology & PSA continued to rise up to 5.9 w/ Bx 1/11 showing AdenoCa, favorable-risk, consult from [facility] & decision to go w/ XRT - 40 treatments planned & now in progress... he has once again stopped his Simva40 (?didn't know he needed to stay on  this) & we discussed this in detail > he wants to check labs first.   ~  February 07, 2010:  Back on Simva40 daily & tol well- he will ret for FLP to check response... he persists w/ mult minor somatic complaints but overall stable & he is active w/o CP, palpit, SOB, etc... he had colonoscopy 7/11 by DrStark w/ several polyps removed (tubular adenomas), melanosis & hems... also had f/u DrBorden for Urology w/ XRT completed 6/11 & f/u PSA starting to decline; he is on Flomax for LUTS & trying to wean off this med...   Current Problem List:  Hx of ASTHMATIC BRONCHITIS, ACUTE (ICD-466.0) - Never smoked;  rare bronchitic infections.  OBSTRUCTIVE SLEEP APNEA (ICD-327.23) - Sleep study 2/03 showed RDI 15 & mild desat 83% + loud snoring;  sleep consult DrClance 4/03 w/ attempt at improved sleep hygiene;  later tried CPAP 8cm & intol, pt never f/u.  Hx of PAC (ICD-427.61) - on ASA 81mg /d... Baseline EKG is NSR & WNL;  rare PAC's in past w/o further eval;  had GXT 1979 which was WNL;  Nuclear Stress Test 1/97 was WNL- no ischemia & EF=65%...  ~  NuclearStressTest 10/09 was negative- no infarct or ischemia, EF= 54%  HYPERLIPIDEMIA (ICD-272.4) - on Simvastatin 40mg /d + fish oil supplements...  ~  FLP 12/07 showed TChol 214, TG 72, HDL42, LDL169;  pt was rec'd to start Simvistatin  but prefers diet Rx.  ~  FLP 7/09 showed TChol 192, TG 110, HDL 36, LDL 134... improved- continue diet efforts.  ~  FLP 8/10 showed TChol 224, TG 149, HDL 43, LDL 161... rec> start Simva20, ?if he did.  ~  FLP 10/10 showed TChol 224, TG 98, HDL 39, LDL 176... rec incr Simva40, ?he stopped Rx.  ~  FLP off meds 4/11 showed TChol 198, TG 104, HDL 42, LDL 135... not at goal, get back on Simva40.  ~  FLP 10/11 on Simva40 showed = pending  GERD (ICD-530.81) - on NEXIUM 40mg /d... EGD 4/04 by DrSamLeB was WNL.  DIVERTICULAR DISEASE (ICD-562.10) & IBS (ICD-564.1) & COLONIC POLYPS (ICD-211.3)  ~  colonoscopy 4/04 by DrSLeB was WNL& f/u 7  yrs.  ~  colonoscopy 7/11 by DrStark showed several polyps (tubular adenomas), melanosis, & hems... f/u planned 52yrs.  ADENOCARCINOMA, PROSTATE (ICD-185) - referred to Urology 10/10 w/ rising PSA... seen by DrBorden w/ Bx 1/11 + for AdenoCa > he was seen by [facility] & decided on XRT- 40 treatments planned through 6/11...  ~  8/11:  he had f/u DrBorden w/ PSA starting to decline s/p XRT; mult LTUS on Flomax & trying to wean off.  RENAL CYST (ICD-593.2) - Known small renal cyst on prev scan;  Urology eval by DrMcDiarmid 10/06 for nocturia & ED...  ~  labs 2007 to 2010 w/ slow rise in PSA from 2.17 to 2.59 to 3.05...  ~  10/10: he [facility]... he's taking "beta prostate" supplement.  OSTEOARTHRITIS (ICD-715.90) - mod arthritis, esp knees- s/p bilat arthroscopies (Ortho evals in early 2000's by DrPaul)... he still takes Osteobiflex...  ~  10/10:  c/o right hip discomfort & referred to DrAlusio for eval > he did MRI & pt reports not much found.  LOW BACK PAIN, CHRONIC (ICD-724.2) - on Diazepam 5mg  Prn muscle spasm.  NEUROPATHY, HX OF (ICD-V12.40) - Neuro eval DrSchmidt for "burning in legs" 5/03 w/ Dysesthesia (729.5);  treated w/ NEURONTIN 300mg - 2Tid & improved...  MEMORY LOSS (ICD-780.93) - subjective c/o memory loss- family doesn't notice anything... full neuro eval 1/09 by Dagoberto Reef was neg- MMSE 29/30, he was mostly distractable...  ANXIETY (ICD-300.00) - Hx signif anxiety & somatization> uses DIAZEPAM 5mg - 1/2 - 1 tab Tid Prn (for muscle spasm).   Allergies: No Known Drug Allergies  Comments:  Nurse/Medical Assistant: The patient's medications and allergies were reviewed with the patient and were updated in the Medication and Allergy Lists. Boone Master CNA/MA  February 07, 2010 11:54 AM   Past History:  Past Medical History: Hx of ASTHMATIC BRONCHITIS, ACUTE (ICD-466.0) OBSTRUCTIVE SLEEP APNEA (ICD-327.23) Hx of PAC  (ICD-427.61) HYPERLIPIDEMIA (ICD-272.4) GERD (ICD-530.81) DIVERTICULAR DISEASE (ICD-562.10) IBS (ICD-564.1) COLONIC POLYPS (ICD-211.3) ADENOCARCINOMA, PROSTATE (ICD-185) RENAL CYST (ICD-593.2) OSTEOARTHRITIS (ICD-715.90) LOW BACK PAIN, CHRONIC (ICD-724.2) NEUROPATHY, HX OF (ICD-V12.40) MEMORY LOSS (ICD-780.93) ANXIETY (ICD-300.00)  Past Surgical History: Hx Hearing Loss w/ Ear Surg 1986 by DrInabinet Hx Bilat Knee Arthroscopies S/P prostate biopsies +AdenoCa, pt elected XRT- 2011  Family History: Reviewed history from 11/11/2007 and no changes required. mother died age 42 from cancer 2 siblings 1 brother alive age 4 1 brother alive age 75  Social History: Reviewed history from 11/11/2007 and no changes required. divorced works at [employer] never smoked social etoh 3 children  Review of Systems      See HPI  The patient denies anorexia, fever, weight loss, weight gain, vision loss, decreased hearing, hoarseness,  chest pain, syncope, dyspnea on exertion, peripheral edema, prolonged cough, headaches, hemoptysis, abdominal pain, melena, hematochezia, severe indigestion/heartburn, hematuria, incontinence, muscle weakness, suspicious skin lesions, transient blindness, difficulty walking, depression, unusual weight change, abnormal bleeding, enlarged lymph nodes, and angioedema.    Vital Signs:  Patient profile:   70 year old male Height:      70 inches Weight:      227.38 pounds BMI:     32.74 O2 Sat:      100 % on Room air Temp:     97.2 degrees F oral Pulse rate:   92 / minute BP sitting:   128 / 80  (left arm) Cuff size:   regular  Vitals Entered By: Boone Master CNA/MA (February 07, 2010 11:54 AM)  O2 Flow:  Room air  Physical Exam  Additional Exam:  WD, WN, mesomorphic, 70 y/o BM in NAD... GENERAL:  Alert & oriented; pleasant & cooperative... HEENT:  /AT, EOM-wnl, PERRLA, Fundi-benign, EACs-clear, TMs-wnl, NOSE-clear, THROAT-clear & wnl. NECK:  Supple w/  full ROM; no JVD; normal carotid impulses w/o bruits; no thyromegaly or nodules palpated; no lymphadenopathy. CHEST:  Clear to P & A; without wheezes/ rales/ or rhonchi heard... HEART:  Regular Rhythm; without murmurs/ rubs/ or gallops detected... ABDOMEN:  Soft & nontender; normal bowel sounds; no organomegaly or masses palpated... EXT: without deformities, mild arthritic changes; no varicose veins/ venous insuffic/ or edema. NEURO:  CN's intact; motor testing normal; sensory testing normal; gait normal & balance OK. DERM:  No lesions noted; no rash etc...    Impression & Recommendations:  Problem # 1:  Hx of CHEST PAIN (ICD-786.50) He has hx mult somatic complaints>  currently active, denies CP/ angina, palpit, SOB, edema, etc...  Problem # 2:  HYPERLIPIDEMIA (ICD-272.4) He will ret to our lab one morning this week for FASTING labs... His updated medication list for this problem includes:    Simvastatin 40 Mg Tabs (Simvastatin) .Marland Kitchen... 1 by mouth at bedtime  Problem # 3:  COLONIC POLYPS (ICD-211.3) GI followed by DrStark & stable s/o colon 7/11 w/ several tub adenomas removed... f/u planned 50yrs.  Problem # 4:  ADENOCARCINOMA, PROSTATE (ICD-185) Followed by DrBorden as noted>  PSA starting to decline after XRT... he's on Flomax for LUTS & trying to wean off if poss...  Problem # 5:  OSTEOARTHRITIS (ICD-715.90) Stable overall w/ minor ortho complaints... on Osteobiflex etc... His updated medication list for this problem includes:    Adult Aspirin Low Strength 81 Mg Tbdp (Aspirin) .Marland Kitchen... As needed  Problem # 6:  NEUROPATHY, HX OF (ICD-V12.40) Stable on his Neurontin Rx... continue same.  Problem # 7:  ANXIETY (ICD-300.00) He uses the Valium for muscle spasm & nerves... His updated medication list for this problem includes:    Diazepam 5 Mg Tabs (Diazepam) .Marland Kitchen... 1/2 - 1 tab by mouth three times a day as needed muscle spasms  Complete Medication List: 1)  Adult Aspirin Low Strength  81 Mg Tbdp (Aspirin) .... As needed 2)  Omega-3 350 Mg Caps (Omega-3 fatty acids) .Marland Kitchen.. 1 by mouth once daily 3)  Nexium 40 Mg Cpdr (Esomeprazole magnesium) .... Take 1 tab by mouth once daily as needed 4)  Neurontin 300 Mg Caps (Gabapentin) .... Take 2 tabs three times a day as directed... 5)  Osteo Bi-flex Regular Strength 250-200 Mg Tabs (Glucosamine-chondroitin) .... Take one by mouth once daily 6)  Simvastatin 40 Mg Tabs (Simvastatin) .Marland Kitchen.. 1 by mouth at bedtime 7)  Metamucil Smooth Texture  58.6 % Powd (Psyllium) .Marland Kitchen.. 1 tablespoon by mouth once daily as needed 8)  Miralax Powd (Polyethylene glycol 3350) .Marland Kitchen.. 1 capful by mouth once daily as needed 9)  Prostate Tabs (Specialty vitamins products) .... Take 1 tablet by mouth once a day 10)  Flomax 0.4 Mg Caps (Tamsulosin hcl) .... Take 1 capsule by mouth at bedtime 11)  Diazepam 5 Mg Tabs (Diazepam) .... 1/2 - 1 tab by mouth three times a day as needed muscle spasms  Patient Instructions: 1)  Today we updated your med list- see below.... 2)  Continue your current meds the same for now... 3)  Please return to our lab FASTING one morning this week for your follow up Cholesterol check... then please call the "phone tree" in a few days for your lab results.Marland KitchenMarland Kitchen  4)  Call for any problems.Marland KitchenMarland Kitchen 5)  Please schedule a follow-up appointment in 6 months.   Immunization History:  Influenza Immunization History:    Influenza:  historical (11/15/2009)

## 2010-05-17 NOTE — Letter (Signed)
Summary: [facility]   Imported By: Sherian Rein 12/09/2009 10:02:58  _____________________________________________________________________  External Attachment:    Type:   Image     Comment:   External Document

## 2010-05-17 NOTE — Assessment & Plan Note (Signed)
Summary: Gastroenterology  Brett Terrell  MR#:  [id number] Page #  [facility]   GASTROENTEROLOGY OFFICE NOTE  NAME:  Brett, Terrell   OFFICE NO:  [id number]  DATE:  07/28/02  DOB:  Aug 23, 2040  HISTORY OF PRESENT ILLNESS:   The patient comes in on 07-28-02 and says he has had increasing constipation, uses laxatives and has for a long time and needs to do this just to have a bowel movement.  He has lots of bloating and gas.  He has been taking Neurontin, Herb-Lax, Nexium 40 mg daily, Zocor, colon cleanse.  He says he also has some reflux symptoms and some left upper quadrant pain at times and this has concerned them as well.  He says he is having difficulty sleeping, has to get up at night to urinate.  He has been diagnosed in the past with some sleep apnea and I have seen in the past and suggested that he has had evidence of irritable bowel syndrome.  He does have a diagnosis also of pancreatic divisum.  His last colonoscopic examination was in 1999 and was essentially normal but he has had previous polyps of the colon.    PHYSICAL EXAMINATION:   His weight was 222.  Blood pressure 120/89.  Pulse 78 and regular.  Neck was negative.  Heart revealed a regular rate and rhythm without significant murmur.  Abdomen soft.  No masses or organomegaly, nontender, no bruits or rubs.    IMPRESSION: 1.   Increasing constipation requiring laxatives with bloating and gas probably related to irritable bowel syndrome with constipation component.   2.   Gastroesophageal reflux disease with some left upper quadrant discomfort.   3.   Sleep apnea.   4.   Probable associated anxiety/depression.    RECOMMENDATION:  That he be scheduled for endo, colon, get an ultrasound of his abdomen, and be put on some Benefiber to see if this gives him any relief.      Ulyess Mort, M.D.  [id number]  D:  07/28/02; T:  ; Job [phone]

## 2010-05-17 NOTE — Progress Notes (Signed)
Summary: medication ?- ATC NAx3  Phone Note Call from Patient   Caller: Patient Call For: nadel Summary of Call: need to talk to nurse about medication Initial call taken by: Rickard Patience,  August 12, 2009 10:42 AM  Follow-up for Phone Call        ATC pt at home # and NA, unable to leave msg.  ATC work # and this has been d/c'ed. WCB. Vernie Murders  August 12, 2009 11:01 AM   Additional Follow-up for Phone Call Additional follow up Details #1::        Spoke with pt.  He wants to know if SN wants him to start back on simvastatin 40 mg at bedtime.  Looks like SN wanted to see what the labs showed first.  Labs still unsigned.  Pls advise, thanks  Additional Follow-up by: Vernie Murders,  August 12, 2009 11:21 AM    Additional Follow-up for Phone Call Additional follow up Details #2::    per SN---yes  chol is 200, ldl is 135  goal is less than 100 and preferred less than 70.  SN rec restart the simvastatin 40mg   1 at bedtime #30   refill as needed   and stay on this med.  thanks Randell Loop CMA  August 12, 2009 11:46 AM   ATC pt w/ these recs and NA, unable to leave a msg, WCB. Vernie Murders  August 12, 2009 11:47 AM  ATC, NA, unable to leave msg, [facility]. Carron Curie CMA  August 12, 2009 2:35 PM  ATC pt again and still no answer and no way to leave a msg.  WCB on 4/29. Vernie Murders  August 12, 2009 5:19 PM   Additional Follow-up for Phone Call Additional follow up Details #3:: Details for Additional Follow-up Action Taken: Spoke with pt and advised of results/recs per SN.  Pt verbalized understanding and rx was sent to [facility] [address]. Additional Follow-up by: Vernie Murders,  August 13, 2009 8:50 AM  New/Updated Medications: SIMVASTATIN 40 MG TABS (SIMVASTATIN) 1 by mouth at bedtime Prescriptions: SIMVASTATIN 40 MG TABS (SIMVASTATIN) 1 by mouth at bedtime  #30 x 11   Entered by:   Vernie Murders   Authorized by:   Michele Mcalpine MD   Signed by:   Vernie Murders on  08/13/2009   Method used:   Electronically to        [facility] [address] [phone]* (retail)       [address]       [place], [place]  [address]       Ph: [phone]       Fax: [phone]   RxID:   [id number]

## 2010-05-17 NOTE — Letter (Signed)
Summary: [facility]  [address]   [place], [place] [address]   Phone: [phone]  Fax: [phone]    03/01/2010  [facility] [address] [place], [place]  [address]  Dear Mr. Swayne,   We have been trying to contact you about your lab results without success.  Please contact our office at your earliest convenience.  Thank you.      Sincerely,   Randell Loop CMA

## 2010-05-17 NOTE — Letter (Signed)
Summary: Patient Notice- Polyp Results   Gastroenterology  [address] [place], [place] [address]   Phone: [phone]  Fax: [phone]        November 01, 2009 MRN: [id number]    [facility] [address] [place], [place]  [address]    Dear Mr. Talbot,  I am pleased to inform you that the colon polyp(s) removed during your recent colonoscopy was (were) found to be benign (no cancer detected) upon pathologic examination.  I recommend you have a repeat colonoscopy examination in 5 years to look for recurrent polyps, as having colon polyps increases your risk for having recurrent polyps or even colon cancer in the future.  Should you develop new or worsening symptoms of abdominal pain, bowel habit changes or bleeding from the rectum or bowels, please schedule an evaluation with either your primary care physician or with me.  Continue treatment plan as outlined the day of your exam.  Please call us if you are having persistent problems or have questions about your condition that have not been fully answered at this time.  Sincerely,  Meryl Dare MD [facility]  This letter has been electronically signed by your physician.  Appended Document: Patient Notice- Polyp Results letter mailed.

## 2010-05-17 NOTE — Procedures (Signed)
Summary: Colonoscopy   Colonoscopy  Procedure date:  08/14/2002  Findings:      Results: Normal. Location:  [facility].    Procedures Next Due Date:    Colonoscopy: 08/2009  Colonoscopy  Procedure date:  08/14/2002  Findings:      Results: Normal. Location:  [facility].    Procedures Next Due Date:    Colonoscopy: 08/2009 Patient Name: Brett Terrell, Brett Terrell MRN:  Procedure Procedures: Colonoscopy CPT: 16109.  Personnel: Endoscopist: Ulyess Mort, MD.  Exam Location: Exam performed in Outpatient Clinic. Outpatient  Patient Consent: Procedure, Alternatives, Risks and Benefits discussed, consent obtained, from patient. Consent was obtained by the RN.  Indications  Surveillance of: Adenomatous Polyp(s).  History  Pre-Exam Physical: Performed Aug 14, 2002. Cardio-pulmonary exam, Rectal exam, HEENT exam , Abdominal exam, Extremity exam, Mental status exam WNL.  Exam Exam: Extent of exam reached: Cecum, extent intended: Cecum.  The cecum was identified by appendiceal orifice and IC valve. Colon retroflexion performed. Images were not taken. ASA Classification: II. Tolerance: good.  Monitoring: Pulse and BP monitoring, Oximetry used. Supplemental O2 given.  Colon Prep Prep results: good.  Sedation Meds: Patient assessed and found to be appropriate for moderate (conscious) sedation. Fentanyl 100 mcg. given IV. Versed 10 mg.  Findings - NOT SEEN ON EXAM: Cecum to Rectum. Polyps, AVM's, Colitis, Tumors, Melanosis, Crohn's, Comments: min. diverticulosis.   Assessment Normal examination.  Events  Unplanned Interventions: No intervention was required.  Unplanned Events: There were no complications. Plans Medication Plan: Continue current medications.  Patient Education: Patient given standard instructions for: a normal exam. Yearly hemoccult testing recommended. Patient instructed to get routine colonoscopy every 7 years.    Disposition: After procedure patient sent to recovery. After recovery patient sent home.  This report was created from the original endoscopy report, which was reviewed and signed by the above listed endoscopist.    cc: Lonzo Cloud. Kriste Basque, MD

## 2010-05-17 NOTE — Assessment & Plan Note (Signed)
Summary: 6 months/apc   CC:  6 month ROV & review of mult medical problems....  History of Present Illness: 70 y/o BM here for a follow up visit... he has multiple medical problems as noted below...    ~  Jul09:  he has a hx of  multiple somatic complaints:  c/o small VV left leg- non-tender, no pain, he's considering vein clinic... rec: no salt, elevate, support;  c/o constipation and stools stick to the sides of the commode- discussed Miralax + Senakot-S;  c/o right hip pain on & off- he's seen DrAlusio in past;  c/o BB sized cyst under skin of right buttock... I still can't feel it- rec: excision if it bothers him;  c/o gas- discussed Mylicon, GasX, BeanO, Phazyme;  he wants Androderm patches- he's currently using "Estroblaster" supplement to prevent breast tissue developement!   ~  April 08, 2008:  he had NuclearStressTest 02/10/08- negative w/o infarct or ischemia, EF= 54%... but the tech mentioned leg dragging to the pt & he is concerned "they said I should get my circulation tested"... no exercise pain, certainly no rest pain, sores, etc... he does have arthritis in knees w/ prev arthroscopies by DrSue & DrAlusio... plus a mod severe peripheral neuropathy (idiopathic) that is better w/ Neurontin 300mg - taking 2Tid... Art & Venous dopplers 12.09 were neg- WNL...   ~  February 10, 2009:  he has retired from [employer]... doing satis but concerned about his right hip discomfort, and something about a report from [facility] prostate screening clinic (he will bring the report for me to review >> PSA at [facility] 9/10 was 3.26 & we will erfer to Urology for rising PSA) ... his TChol & LDL are elevated & we started simva Rx.   ~  August 10, 2009:  referred to DrBorden for Urology & PSA continued to rise up to 5.9 w/ Bx 1/11 showing AdenoCa, favorable-risk, consult from [facility] & decision to go w/ XRT - 40 treatments planned & now in progress... he has once again stopped his Simva40 )?didn't know he needed  to stay on this) & we discussed this in detail > he wants to check labs first.   Current Problem List:  Hx of ASTHMATIC BRONCHITIS, ACUTE (ICD-466.0) - Never smoked;  rare bronchitic infections.  OBSTRUCTIVE SLEEP APNEA (ICD-327.23) - Sleep study 2/03 showed RDI 15 & mild desat 83% + loud snoring;  sleep consult DrClance 4/03 w/ attempt at improved sleep hygiene;  later tried CPAP 8cm & intol, pt never f/u.  Hx of PAC (ICD-427.61) - on ASA 81mg /d... Baseline EKG is NSR & WNL;  rare PAC's in past w/o further eval;  had GXT 1979 which was WNL;  Nuclear Stress Test 1/97 was WNL- no ischemia & EF=65%...  ~  NuclearStressTest 10/09 was negative- no infarct or ischemia, EF= 54%  HYPERLIPIDEMIA (ICD-272.4) - prev on Simvastatin + fish oil supplements...  ~  FLP 12/07 showed TChol 214, TG 72, HDL42, LDL169;  pt was rec'd to start Simvistatin but prefers diet Rx.  ~  FLP 7/09 showed TChol 192, TG 110, HDL 36, LDL 134... improved- continue diet efforts.  ~  FLP 8/10 showed TChol 224, TG 149, HDL 43, LDL 161... rec> start Simva20, ?if he did.  ~  FLP 10/10 showed TChol 224, TG 98, HDL 39, LDL 176... rec incr Simva40, ?he stopped Rx.  ~  FLP off meds 4/11 showed TChol 198, TG 104, HDL 42, LDL 135... not at goal, get back on  Simva40.  GERD (ICD-530.81) - on NEXIUM 40mg /d... EGD 4/04 by DrSamLeB was WNL.  DIVERTICULAR DISEASE (ICD-562.10) & IBS (ICD-564.1) & COLONIC POLYPS (ICD-211.3) -  - Last colonoscopy 4/04 by DrSLeB was WNL& f/u 7 yrs.  ADENOCARCINOMA, PROSTATE (ICD-185) - referred to Urology 10/10 w/ rising PSA... seen by DrBorden w/ Bx 1/11 + for AdenoCa > he was seen by [facility] & decided on XRT- 40 treatments planned through 5/11...  RENAL CYST (ICD-593.2) - Known small renal cyst on prev scan;  Urology eval by DrMcDiarmid 10/06 for nocturia & ED...  ~  labs 2007 to 2010 w/ slow rise in PSA from 2.17 to 2.59 to 3.05...  ~  10/10: he reports [facility]... he's  taking "beta prostate" supplement.  OSTEOARTHRITIS (ICD-715.90) - mod arthritis, esp knees- s/p bilat arthroscopies...  ~  10/10:  c/o right hip discomfort & referred to DrAlusio for eval > he did MRI & pt reports not much found.  LOW BACK PAIN, CHRONIC (ICD-724.2) - Ortho evals in early 2000's by DrPaul w/ knee arthroscopies etc...  NEUROPATHY, HX OF (ICD-V12.40) - Neuro eval DrSchmidt for "burning in legs" 5/03 w/ Dysesthesia (729.5);  treated w/ NEURONTIN 300mg - 2Tid & improved...  MEMORY LOSS (ICD-780.93) - subjective c/o memory loss- family doesn't notice anything... full neuro eval 1/09 by Dagoberto Reef was neg- MMSE 29/30, he was mostly distractable...  ANXIETY (ICD-300.00) - Hx signif anxiety & somatization.   Allergies (verified): No Known Drug Allergies  Past History:  Past Medical History:  Hx of ASTHMATIC BRONCHITIS, ACUTE (ICD-466.0) OBSTRUCTIVE SLEEP APNEA (ICD-327.23) Hx of PAC (ICD-427.61) HYPERLIPIDEMIA (ICD-272.4) GERD (ICD-530.81) DIVERTICULAR DISEASE (ICD-562.10) IBS (ICD-564.1) COLONIC POLYPS (ICD-211.3) ADENOCARCINOMA, PROSTATE (ICD-185) RENAL CYST (ICD-593.2) OSTEOARTHRITIS (ICD-715.90) LOW BACK PAIN, CHRONIC (ICD-724.2) NEUROPATHY, HX OF (ICD-V12.40) MEMORY LOSS (ICD-780.93) ANXIETY (ICD-300.00)  Past Surgical History: Hx Hearing Loss w/ Ear Surg 1986 by DrInabinet Hx Bilat Knee Arthroscopies S/P prostate biopsies +AdenoCa, pt elected XRT- 2011  Family History: Reviewed history from 11/11/2007 and no changes required. mother died age 15 from cancer 2 siblings 1 brother alive age 5 1 brother alive age 29  Social History: Reviewed history from 11/11/2007 and no changes required. divorced works at [employer] never smoked social etoh 3 children  Review of Systems      See HPI  The patient denies anorexia, fever, weight loss, weight gain, vision loss, decreased hearing, hoarseness, chest pain, syncope, dyspnea on exertion, peripheral edema,  prolonged cough, headaches, hemoptysis, abdominal pain, melena, hematochezia, severe indigestion/heartburn, hematuria, incontinence, muscle weakness, suspicious skin lesions, transient blindness, difficulty walking, depression, unusual weight change, abnormal bleeding, enlarged lymph nodes, and angioedema.    Vital Signs:  Patient profile:   71 year old male Weight:      220 pounds O2 Sat:      95 % on Room air Temp:     97.7 degrees F oral Pulse rate:   84 / minute BP sitting:   110 / 78  (left arm) Cuff size:   regular  Vitals Entered By: Abigail Miyamoto RN (August 10, 2009 11:37 AM)  O2 Flow:  Room air  Physical Exam  Additional Exam:  WD, WN, mesomorphic, 70 y/o BM in NAD... GENERAL:  Alert & oriented; pleasant & cooperative... HEENT:  [place]/AT, EOM-wnl, PERRLA, Fundi-benign, EACs-clear, TMs-wnl, NOSE-clear, THROAT-clear & wnl. NECK:  Supple w/ full ROM; no JVD; normal carotid impulses w/o bruits; no thyromegaly or nodules palpated; no lymphadenopathy. CHEST:  Clear to P & A; without  wheezes/ rales/ or rhonchi heard... HEART:  Regular Rhythm; without murmurs/ rubs/ or gallops detected... ABDOMEN:  Soft & nontender; normal bowel sounds; no organomegaly or masses palpated... EXT: without deformities, mild arthritic changes; no varicose veins/ venous insuffic/ or edema. NEURO:  CN's intact; motor testing normal; sensory testing normal; gait normal & balance OK. DERM:  No lesions noted; no rash etc...    MISC. Report  Procedure date:  08/10/2009  Findings:      DATA REVIEWED:  Extensive notes from Urology, DrBorden & RadOnc, DrMurray > all reviewed w/ the pt...     MISC. Report  Procedure date:  08/10/2009  Findings:      BMP (METABOL)   Sodium               [H]  146 mEq/L                   135-145   Potassium                 3.9 mEq/L                   3.5-5.1   Chloride                  107 mEq/L                   96-112   Carbon Dioxide            32 mEq/L                     19-32   Glucose                   87 mg/dL                    16-10   BUN                       11 mg/dL                    9-60   Creatinine                1.1 mg/dL                   4.5-4.0   Calcium                   9.6 mg/dL                   9.8-11.9   GFR                       85.51 mL/min                >60  Hepatic/Liver Function Panel (HEPATIC)   Total Bilirubin           1.0 mg/dL                   1.4-7.8   Direct Bilirubin          0.1 mg/dL                   2.9-5.6   Alkaline Phosphatase      81 U/L                      39-117  AST                       28 U/L                      0-37   ALT                       26 U/L                      0-53   Total Protein             7.3 g/dL                    1.6-1.0   Albumin                   4.6 g/dL                    9.6-0.4  CBC Platelet w/Diff (CBCD)   White Cell Count          7.2 K/uL                    4.5-10.5   Red Cell Count            4.95 Mil/uL                 4.22-5.81   Hemoglobin                15.4 g/dL                   54.0-98.1   Hematocrit                44.4 %                      39.0-52.0   MCV                       89.8 fl                     78.0-100.0   Platelet Count            236.0 K/uL                  150.0-400.0   Neutrophil %              68.0 %                      43.0-77.0   Lymphocyte %              20.1 %                      12.0-46.0   Monocyte %                9.8 %                       3.0-12.0   Eosinophils%              1.9 %                       0.0-5.0   Basophils %  0.2 %                       0.0-3.0  Comments:      Lipid Panel (LIPID)   Cholesterol               198 mg/dL                   2-952   Triglycerides             104.0 mg/dL                 8.4-132.4   HDL                       40.10 mg/dL                 >27.25   LDL Cholesterol      [H]  366 mg/dL                   4-40  Tests: (5) TSH (TSH)   FastTSH                   0.79 uIU/mL                  0.35-5.50   Impression & Recommendations:  Problem # 1:  ADENOCARCINOMA, PROSTATE (ICD-185) Currently getting XRT from [facility]... 40 treatments planned thru 5/11... Orders: TLB-BMP (Basic Metabolic Panel-BMET) (80048-METABOL) TLB-Hepatic/Liver Function Pnl (80076-HEPATIC) TLB-CBC Platelet - w/Differential (85025-CBCD) TLB-Lipid Panel (80061-LIPID) TLB-TSH (Thyroid Stimulating Hormone) (84443-TSH)  Problem # 2:  Hx of ASTHMATIC BRONCHITIS, ACUTE (ICD-466.0) No bronchitic infections or exacerbations...  Problem # 3:  OBSTRUCTIVE SLEEP APNEA (ICD-327.23) Sleeping satis 7 denies sleep disordered breathing, daytime symptoms, snoring, etc...  Problem # 4:  Hx of PAC (ICD-427.61) No CP, palpit, SOB... His updated medication list for this problem includes:    Adult Aspirin Low Strength 81 Mg Tbdp (Aspirin) .Marland Kitchen... As needed  Problem # 5:  HYPERLIPIDEMIA (ICD-272.4) He did not understand that he needed to stay on this med... wants to check FLP off med & then will consider restart... The following medications were removed from the medication list:    Simvastatin 40 Mg Tabs (Simvastatin) .Marland Kitchen... 1 by mouth at bedtime  Problem # 6:  GERD (ICD-530.81) GI is stable-  continue meds. His updated medication list for this problem includes:    Nexium 40 Mg Cpdr (Esomeprazole magnesium) .Marland Kitchen... Take 1 tab by mouth once daily as needed  Problem # 7:  OSTEOARTHRITIS (ICD-715.90) Had right hip eval by DrAlusio- neg... The following medications were removed from the medication list:    Etodolac 400 Mg Tabs (Etodolac) .Marland Kitchen... Take 1 tab by mouth up to two times a day w/ food    Tramadol Hcl 50 Mg Tabs (Tramadol hcl) .Marland Kitchen... 1 tab every 6 hours as needed pain His updated medication list for this problem includes:    Adult Aspirin Low Strength 81 Mg Tbdp (Aspirin) .Marland Kitchen... As needed  Problem # 8:  NEUROPATHY, HX OF (ICD-V12.40) He continues on the Neurontin...  Problem # 9:  ANXIETY (ICD-300.00) He  is stable> even w/ the Ca dx & doesn't want nerve pill...  Complete Medication List: 1)  Adult Aspirin Low Strength 81 Mg Tbdp (Aspirin) .... As needed 2)  Omega-3 350 Mg Caps (Omega-3 fatty acids) .Marland Kitchen.. 1 by mouth once daily 3)  Nexium 40 Mg Cpdr (Esomeprazole magnesium) .Marland KitchenMarland KitchenMarland Kitchen  Take 1 tab by mouth once daily as needed 4)  Neurontin 300 Mg Caps (Gabapentin) .... Take 2 tabs three times a day as directed... 5)  Osteo Bi-flex Regular Strength 250-200 Mg Tabs (Glucosamine-chondroitin) .... Take one by mouth once daily  Patient Instructions: 1)  Today we updated your med list- see below.... 2)  Continue your current meds the same... 3)  Today we did your follow up blood work... please call the "phone tree" in a few days for your lab results.Marland KitchenMarland Kitchen 4)  Call for any problems.Marland KitchenMarland Kitchen 5)  Please schedule a follow-up appointment in 6 months.   Influenza Immunization History:    Influenza # 1:  Historical (01/28/2009)

## 2010-05-17 NOTE — Procedures (Signed)
Summary: [facility]   Imported By: Sherian Rein 08/25/2009 15:00:56  _____________________________________________________________________  External Attachment:    Type:   Image     Comment:   External Document

## 2010-05-17 NOTE — Procedures (Signed)
Summary: [facility]   Imported By: Sherian Rein 08/25/2009 15:01:58  _____________________________________________________________________  External Attachment:    Type:   Image     Comment:   External Document

## 2010-05-17 NOTE — Letter (Signed)
[facility]   Imported By: Lester [place] 05/07/2009 08:53:38  _____________________________________________________________________  External Attachment:    Type:   Image     Comment:   External Document

## 2010-05-17 NOTE — Letter (Signed)
Summary: [facility]   Imported By: Sherian Rein 05/28/2009 12:48:42  _____________________________________________________________________  External Attachment:    Type:   Image     Comment:   External Document

## 2010-05-19 NOTE — Progress Notes (Signed)
Summary: light- headed / high bp - appt w/ TP 12.22.11  Phone Note Call from Patient Call back at Home Phone [phone]   Caller: Patient Call For: nadel Summary of Call: pt states that he felt "whoozy" today when he left the barber shop. "not when he first got up from the chair but after walking around a bit". he went to his friend's house and checked his bp. pt says it was a high reading but doesn't recall what it was. pt says the only med that he has added since seeing dr Kriste Basque last is a rx for prostate. i asked him to have the med bottle handy when nurse calls him back. [facility] on [address].  Initial call taken by: Tivis Ringer, CNA,  April 06, 2010 2:00 PM  Follow-up for Phone Call        ATCx2 no answer, WCB. Carron Curie CMA  April 06, 2010 3:59 PM  pt returned call.  he states that when he went to the barber shop this morning he began having some dizziness and "wooziness" after getting up from the chair.  pt stated he went to a friends' house to check his BP and it was "way up there" but is unable to recall.  i advised pt that appt is needed to evaluate this.  pt verbalized his understanding.  appt scheduled w/ TP 12.22.11 @ 1115.  pt okay with this date and time. Boone Master CNA/MA  April 06, 2010 4:17 PM

## 2010-05-19 NOTE — Assessment & Plan Note (Signed)
Summary: Acute NP office visit - HTN   CC:  c/o some dizziness and "wooziness" .  pt stated he went to a friends' house to check his BP yesterday after feeling worse from the barber shop and it was "way up there" but is unable to recall the value.Marland Kitchen  History of Present Illness: 70 y/o BM here for a follow up visit... he has multiple medical problems- see PMH.    ~  April 08, 2008:  he had NuclearStressTest 02/10/08- negative w/o infarct or ischemia, EF= 54%... but the tech mentioned leg dragging to the pt & he is concerned "they said I should get my circulation tested"... no exercise pain, certainly no rest pain, sores, etc... he does have arhtritis in knees w/ prev arthroscopies by DrSue & DrAlusio... plus a mod severe peripheral neuropathy (idiopathic) that is better w/ Neurontin 300mg - taking 2Tid... we agreed to order the Art & Venous dopplers at his request.  November 27, 2008--Presents for work in visit. Complains of "bump" at the right hip/buttock.  states is painful x3-4days Previously seen by Dr. Kriste Basque , who could not palpate this. Complains he feels c/o BB sized cyst under skin along right lateral hip. No redness or rash.   April 07, 2010--Presents for blood pressure check. Yesterday he had his hair cut after he stood up and felt dizziness and "wooziness".   pt stated he went to a friends' house to check his BP yesterday after feeling worse from the barber shop and it was "way up there" but is unable to recall the value.Pt friend checked his b/p with wirst cuff and told him it was high but he does remember the number. He feels fine today w/ no dizziness. b/p in office today is nml. no headache, visual/speech changes or chest pain, palpitations, ext weakness. Denies chest pain, dyspnea, orthopnea, hemoptysis, fever, n/v/d, edema, headache.       Medications Prior to Update: 1)  Omega-3 350 Mg  Caps (Omega-3 Fatty Acids) .Marland Kitchen.. 1 By Mouth Once Daily 2)  Neurontin 300 Mg  Caps  (Gabapentin) .... Take 2 Tabs Three Times A Day As Directed... 3)  Osteo Bi-Flex Regular Strength 250-200 Mg Tabs (Glucosamine-Chondroitin) .... Take One By Mouth Once Daily 4)  Simvastatin 40 Mg Tabs (Simvastatin) .Marland Kitchen.. 1 By Mouth At Bedtime 5)  Metamucil Smooth Texture 58.6 % Powd (Psyllium) .Marland Kitchen.. 1 Tablespoon By Mouth Once Daily As Needed 6)  Miralax  Powd (Polyethylene Glycol 3350) .Marland Kitchen.. 1 Capful By Mouth Once Daily As Needed 7)  Prostate  Tabs (Specialty Vitamins Products) .... Take 1 Tablet By Mouth Once A Day 8)  Flomax 0.4 Mg Caps (Tamsulosin Hcl) .... Take 1 Capsule By Mouth At Bedtime 9)  Diazepam 5 Mg Tabs (Diazepam) .... 1/2 - 1 Tab By Mouth Three Times A Day As Needed Muscle Spasms 10)  Adult Aspirin Low Strength 81 Mg  Tbdp (Aspirin) .... As Needed 11)  Nexium 40 Mg  Cpdr (Esomeprazole Magnesium) .... Take 1 Tab By Mouth Once Daily As Needed  Current Medications (verified): 1)  Adult Aspirin Low Strength 81 Mg  Tbdp (Aspirin) .... As Needed 2)  Omega-3 350 Mg  Caps (Omega-3 Fatty Acids) .Marland Kitchen.. 1 By Mouth Once Daily 3)  Nexium 40 Mg  Cpdr (Esomeprazole Magnesium) .... Take 1 Tab By Mouth Once Daily As Needed 4)  Neurontin 300 Mg  Caps (Gabapentin) .... Take 2 Tabs Three Times A Day As Directed... 5)  Osteo Bi-Flex Regular Strength 250-200 Mg Tabs (Glucosamine-Chondroitin) .Marland KitchenMarland KitchenMarland Kitchen  Take One By Mouth Once Daily 6)  Simvastatin 40 Mg Tabs (Simvastatin) .Marland Kitchen.. 1 By Mouth At Bedtime 7)  Metamucil Smooth Texture 58.6 % Powd (Psyllium) .Marland Kitchen.. 1 Tablespoon By Mouth Once Daily As Needed 8)  Miralax  Powd (Polyethylene Glycol 3350) .Marland Kitchen.. 1 Capful By Mouth Once Daily As Needed 9)  Prostate  Tabs (Specialty Vitamins Products) .... Take 1 Tablet By Mouth Once A Day 10)  Flomax 0.4 Mg Caps (Tamsulosin Hcl) .... Take 1 Capsule By Mouth At Bedtime 11)  Diazepam 5 Mg Tabs (Diazepam) .... 1/2 - 1 Tab By Mouth Three Times A Day As Needed Muscle Spasms  Allergies (verified): No Known Drug Allergies  Past  History:  Past Medical History: Last updated: 2010-02-18 Hx of ASTHMATIC BRONCHITIS, ACUTE (ICD-466.0) OBSTRUCTIVE SLEEP APNEA (ICD-327.23) Hx of PAC (ICD-427.61) HYPERLIPIDEMIA (ICD-272.4) GERD (ICD-530.81) DIVERTICULAR DISEASE (ICD-562.10) IBS (ICD-564.1) COLONIC POLYPS (ICD-211.3) ADENOCARCINOMA, PROSTATE (ICD-185) RENAL CYST (ICD-593.2) OSTEOARTHRITIS (ICD-715.90) LOW BACK PAIN, CHRONIC (ICD-724.2) NEUROPATHY, HX OF (ICD-V12.40) MEMORY LOSS (ICD-780.93) ANXIETY (ICD-300.00)  Past Surgical History: Last updated: February 18, 2010 Hx Hearing Loss w/ Ear Surg 1986 by Jeanie Sewer Hx Bilat Knee Arthroscopies S/P prostate biopsies +AdenoCa, pt elected XRT- 2011  Family History: Last updated: 02-18-2010 mother died age 40 from cancer 2 siblings 1 brother alive age 50 1 brother alive age 39  Social History: Last updated: 2010/02/18 divorced works at [employer] never smoked social etoh 3 children  Risk Factors: Smoking Status: never (04/08/2008)  Review of Systems      See HPI  Vital Signs:  Patient profile:   70 year old male Height:      70 inches Weight:      221.50 pounds BMI:     31.90 O2 Sat:      98 % on Room air Temp:     98.8 degrees F oral Pulse rate:   89 / minute BP sitting:   118 / 78  (right arm) Cuff size:   regular  Vitals Entered By: Boone Master CNA/MA (April 07, 2010 11:39 AM)  O2 Flow:  Room air CC: c/o some dizziness and "wooziness" .  pt stated he went to a friends' house to check his BP yesterday after feeling worse from the barber shop and it was "way up there" but is unable to recall the value. Is Patient Diabetic? No Comments Medications reviewed with patient Daytime contact number verified with patient. Boone Master CNA/MA  April 07, 2010 11:41 AM    Physical Exam  Additional Exam:  GEN: A/Ox3; pleasant , NAD HEENT:  [place]/AT, , EACs-clear, TMs-wnl, NOSE-clear, THROAT-clear NECK:  Supple w/ fair ROM; no JVD; normal carotid  impulses w/o bruits; no thyromegaly or nodules palpated; no lymphadenopathy. RESP  Clear to P & A; w/o, wheezes/ rales/ or rhonchi. CARD:  RRR, no m/r/g   GI:   Soft & nt; nml bowel sounds; no organomegaly or masses detected. Musco: Warm bil,  no calf tenderness edema, clubbing, pulses intact Neuro: EOM-wnl, PERRLA, CN 2-12 intact,nml equal grips/streng , nml gait    Impression & Recommendations:  Problem # 1:  DIZZINESS (ICD-780.4)  Transient dizziness w/ ? elevted blood pressure.  His b/p is fine today, neuro exam appear nml.  advised on low salt diet.  labs pending.  could have been orthostatc changes-he is on flomax  no orthostasis today in office.  advised on adequate fluiid intake.  Plan:  Your blood pressure 118/78 today Drink plenty fluids.  Change positions slowly.  I will call with  lab results.  Please contact office for sooner follow up if symptoms do not improve or worsen  follow up Dr. Kriste Basque as scheduled and as needed   Orders: TLB-BMP (Basic Metabolic Panel-BMET) (80048-METABOL) TLB-Hepatic/Liver Function Pnl (80076-HEPATIC) Est. Patient Level IV (16109)  Complete Medication List: 1)  Omega-3 350 Mg Caps (Omega-3 fatty acids) .Marland Kitchen.. 1 by mouth once daily 2)  Neurontin 300 Mg Caps (Gabapentin) .... Take 2 tabs three times a day as directed... 3)  Osteo Bi-flex Regular Strength 250-200 Mg Tabs (Glucosamine-chondroitin) .... Take one by mouth once daily 4)  Simvastatin 40 Mg Tabs (Simvastatin) .Marland Kitchen.. 1 by mouth at bedtime 5)  Metamucil Smooth Texture 58.6 % Powd (Psyllium) .Marland Kitchen.. 1 tablespoon by mouth once daily as needed 6)  Miralax Powd (Polyethylene glycol 3350) .Marland Kitchen.. 1 capful by mouth once daily as needed 7)  Prostate Tabs (Specialty vitamins products) .... Take 1 tablet by mouth once a day 8)  Flomax 0.4 Mg Caps (Tamsulosin hcl) .... Take 1 capsule by mouth at bedtime 9)  Diazepam 5 Mg Tabs (Diazepam) .... 1/2 - 1 tab by mouth three times a day as needed muscle  spasms 10)  Adult Aspirin Low Strength 81 Mg Tbdp (Aspirin) .... As needed 11)  Nexium 40 Mg Cpdr (Esomeprazole magnesium) .... Take 1 tab by mouth once daily as needed  Patient Instructions: 1)  Your blood pressure 118/78 today 2)  Drink plenty fluids.  3)  Change positions slowly.  4)  I will call with lab results.  5)  Please contact office for sooner follow up if symptoms do not improve or worsen  6)  follow up Dr. Kriste Basque as scheduled and as needed

## 2010-06-08 NOTE — Letter (Signed)
[facility]   Imported By: Sherian Rein 05/30/2010 14:37:46  _____________________________________________________________________  External Attachment:    Type:   Image     Comment:   External Document

## 2010-08-08 ENCOUNTER — Encounter: Payer: Self-pay | Admitting: Pulmonary Disease

## 2010-08-08 ENCOUNTER — Ambulatory Visit (INDEPENDENT_AMBULATORY_CARE_PROVIDER_SITE_OTHER): Payer: Medicare PPO | Admitting: Pulmonary Disease

## 2010-08-08 ENCOUNTER — Other Ambulatory Visit (INDEPENDENT_AMBULATORY_CARE_PROVIDER_SITE_OTHER): Payer: Medicare PPO

## 2010-08-08 DIAGNOSIS — C61 Malignant neoplasm of prostate: Secondary | ICD-10-CM

## 2010-08-08 DIAGNOSIS — I491 Atrial premature depolarization: Secondary | ICD-10-CM

## 2010-08-08 DIAGNOSIS — K219 Gastro-esophageal reflux disease without esophagitis: Secondary | ICD-10-CM

## 2010-08-08 DIAGNOSIS — E785 Hyperlipidemia, unspecified: Secondary | ICD-10-CM

## 2010-08-08 DIAGNOSIS — F411 Generalized anxiety disorder: Secondary | ICD-10-CM

## 2010-08-08 DIAGNOSIS — M1990 Unspecified osteoarthritis, unspecified site: Secondary | ICD-10-CM

## 2010-08-08 DIAGNOSIS — Z8669 Personal history of other diseases of the nervous system and sense organs: Secondary | ICD-10-CM

## 2010-08-08 LAB — CBC WITH DIFFERENTIAL/PLATELET
Basophils Absolute: 0 10*3/uL (ref 0.0–0.1)
Basophils Relative: 0.4 % (ref 0.0–3.0)
Eosinophils Absolute: 0.1 10*3/uL (ref 0.0–0.7)
Hemoglobin: 15.1 g/dL (ref 13.0–17.0)
MCHC: 34.5 g/dL (ref 30.0–36.0)
MCV: 91.1 fl (ref 78.0–100.0)
Monocytes Absolute: 0.6 10*3/uL (ref 0.1–1.0)
Neutro Abs: 4.4 10*3/uL (ref 1.4–7.7)
Neutrophils Relative %: 67.2 % (ref 43.0–77.0)
RBC: 4.81 Mil/uL (ref 4.22–5.81)
RDW: 14.5 % (ref 11.5–14.6)

## 2010-08-08 LAB — HEPATIC FUNCTION PANEL
ALT: 32 U/L (ref 0–53)
Alkaline Phosphatase: 80 U/L (ref 39–117)
Bilirubin, Direct: 0.1 mg/dL (ref 0.0–0.3)
Total Bilirubin: 0.7 mg/dL (ref 0.3–1.2)
Total Protein: 6.8 g/dL (ref 6.0–8.3)

## 2010-08-08 LAB — BASIC METABOLIC PANEL
CO2: 30 mEq/L (ref 19–32)
Chloride: 100 mEq/L (ref 96–112)
Creatinine, Ser: 0.9 mg/dL (ref 0.4–1.5)
Sodium: 142 mEq/L (ref 135–145)

## 2010-08-08 LAB — LIPID PANEL
LDL Cholesterol: 77 mg/dL (ref 0–99)
Total CHOL/HDL Ratio: 3
Triglycerides: 82 mg/dL (ref 0.0–149.0)

## 2010-08-08 MED ORDER — GABAPENTIN 300 MG PO CAPS: ORAL_CAPSULE | ORAL | Status: DC

## 2010-08-08 MED ORDER — SIMVASTATIN 40 MG PO TABS: 40.0000 mg | ORAL_TABLET | Freq: Every day | ORAL | Status: DC

## 2010-08-08 MED ORDER — ESOMEPRAZOLE MAGNESIUM 40 MG PO CPDR: 40.0000 mg | DELAYED_RELEASE_CAPSULE | Freq: Every day | ORAL | Status: DC

## 2010-08-08 NOTE — Progress Notes (Signed)
Subjective:    Patient ID: Brett Terrell, male    DOB: Nov 10, 1940, 70 y.o.   MRN: [id number]  HPI 70 y/o BM here for a follow up visit... he has multiple medical problems as noted below...   ~  August 10, 2009:  referred to DrBorden for Urology & PSA continued to rise up to 5.9 w/ Bx 1/11 showing AdenoCa, favorable-risk, consult from [facility] [place] & decision to go w/ XRT - 40 treatments planned & now in progress... he has once again stopped his Simva40 (?didn't know he needed to stay on this) & we discussed this in detail > he wants to check labs first.  ~  February 07, 2010:  Back on Simva40 daily & tol well- he will ret for FLP to check response... he persists w/ mult minor somatic complaints but overall stable & he is active w/o CP, palpit, SOB, etc... he had colonoscopy 7/11 by DrStark w/ several polyps removed (tubular adenomas), melanosis & hems... also had f/u DrBorden for Urology w/ XRT completed 6/11 & f/u PSA starting to decline; he is on Flomax for LUTS & trying to wean off this med...  ~  August 08, 2010:  56mo ROV & doing satis- he has retired from [employer] starting to work-out etc;  He saw DrBorden 1/12 for f/u Prostate cancer, s/p XRT & improved, some LTOS treated w/ Vesicare;  He brought his meds in today & we reviewed> refills & pill counts are off & he is reminded to take meds regularly;  Due for fasting labs today> see below...         Problem List:  Hx of ASTHMATIC BRONCHITIS, ACUTE (ICD-466.0) - Never smoked;  rare bronchitic infections.  OBSTRUCTIVE SLEEP APNEA (ICD-327.23) - Sleep study 2/03 showed RDI 15 & mild desat 83% + loud snoring;  sleep consult DrClance 4/03 w/ attempt at improved sleep hygiene;  later tried CPAP 8cm & intol, pt never f/u > now denies sleep disordered breathing, rest well, wakes refreshed, no daytime hypersomnolence reported...  Hx of PAC (ICD-427.61) - on ASA 81mg /d... Baseline EKG is NSR & WNL;  rare PAC's in past w/o further eval;   ~  He had GXT  1979 which was WNL ~  Nuclear Stress Test 1/97 was WNL- no ischemia & EF=65%... ~  NuclearStressTest 10/09 was negative- no infarct or ischemia, EF= 54%  HYPERLIPIDEMIA (ICD-272.4) - on Simvastatin 40mg /d + fish oil supplements... ~  FLP 12/07 showed TChol 214, TG 72, HDL42, LDL169;  pt was rec'd to start Simvistatin but prefers diet Rx. ~  FLP 7/09 showed TChol 192, TG 110, HDL 36, LDL 134... improved- continue diet efforts. ~  FLP 8/10 showed TChol 224, TG 149, HDL 43, LDL 161... rec> start Simva20, ?if he did. ~  FLP 10/10 showed TChol 224, TG 98, HDL 39, LDL 176... rec incr Simva40, ?he stopped Rx. ~  FLP off meds 4/11 showed TChol 198, TG 104, HDL 42, LDL 135... not at goal, get back on Simva40. ~  FLP 10/11 on Simva40 showed TChol 131, TG 58, HDL 41, LDL 78 ~  FLP 4/12 on Simva40 showed TChol 138, TG 82, HDL 45, LDL 77  GERD (ICD-530.81) - on NEXIUM 40mg /d... EGD 4/04 by DrSamLeB was WNL.  DIVERTICULAR DISEASE (ICD-562.10) & IBS (ICD-564.1) & COLONIC POLYPS (ICD-211.3) ~  colonoscopy 4/04 by DrSLeB was WNL& f/u 7 yrs. ~  colonoscopy 7/11 by DrStark showed several polyps (tubular adenomas), melanosis, & hems... f/u planned 34yrs.  ADENOCARCINOMA,  PROSTATE (ICD-185) - referred to Urology 10/10 w/ rising PSA... seen by DrBorden w/ Bx 1/11 +for AdenoCa > he was seen by [facility] & decided on XRT- 40 treatments through 6/11... ~  8/11:  he had f/u DrBorden w/ PSA starting to decline s/p XRT; mult LTUS on Flomax & trying to wean off. ~  1/12:  F/u drBorden w/ PSA continuing to fall; Flomax stopped in favor of Vesicare 5mg  trial...  RENAL CYST (ICD-593.2) - Known small renal cyst on prev scan;  Urology eval by DrMcDiarmid 10/06 for nocturia & ED...  OSTEOARTHRITIS (ICD-715.90) - mod arthritis, esp knees- s/p bilat arthroscopies (Ortho evals in early 2000's by DrPaul)... he still takes Osteobiflex... ~  10/10:  c/o right hip discomfort & referred to DrAlusio for eval > he did MRI & pt reports  not much found.  LOW BACK PAIN, CHRONIC (ICD-724.2) - on Diazepam 5mg  Prn muscle spasm.  NEUROPATHY, HX OF (ICD-V12.40) - Neuro eval DrSchmidt for "burning in legs" 5/03 w/ Dysesthesia (729.5);  treated w/ NEURONTIN 300mg - 2Tid & improved...  MEMORY LOSS (ICD-780.93) - subjective c/o memory loss- family doesn't notice anything... full neuro eval 1/09 by Dagoberto Reef was neg- MMSE 29/30, he was mostly distractable...  ANXIETY (ICD-300.00) - Hx signif anxiety & somatization> uses DIAZEPAM 5mg - 1/2 - 1 tab Tid Prn (for muscle spasm). ~  He is a [place] vet, retired from [employer] 2011...   Past Surgical History  Procedure Date  . Knee arthroscopies     bilateral  . Prostate biopsy+ adenoca 2001    pt elected XRT  . Inner ear surgery 1986    Dr. Cloretta Ned    Outpatient Encounter Prescriptions as of 08/08/2010  Medication Sig Dispense Refill  . aspirin 81 MG tablet Take 81 mg by mouth as needed.        . diazepam (VALIUM) 5 MG tablet Take 5 mg by mouth. 1/2-1 tab by mouth three times a day as needed muscle spasms       . esomeprazole (NEXIUM) 40 MG capsule Take 40 mg by mouth daily.        Marland Kitchen gabapentin (NEURONTIN) 300 MG capsule Take 300 mg by mouth. Take 2 tabs three times daily as directed       . Glucosamine-Chondroitin (OSTEO BI-FLEX REGULAR STRENGTH) 250-200 MG TABS Take by mouth daily.        Marland Kitchen HYDROcodone-acetaminophen (VICODIN) 5-500 MG per tablet Take 1 tablet by mouth every 4 (four) hours as needed.        . Omega-3 350 MG CAPS Take by mouth daily.        . polyethylene glycol powder (MIRALAX) powder Take 17 g by mouth. 1 capful by mouth daily as needed       . psyllium (METAMUCIL) 58.6 % powder Take 1 packet by mouth daily as needed.        . simvastatin (ZOCOR) 40 MG tablet Take 40 mg by mouth at bedtime.        . solifenacin (VESICARE) 5 MG tablet Take 10 mg by mouth daily.        . Tamsulosin HCl (FLOMAX) 0.4 MG CAPS Take by mouth at bedtime.          No Known  Allergies   Review of Systems         See HPI - all other systems neg except as noted... The patient denies anorexia, fever, weight loss, weight gain, vision loss, decreased hearing, hoarseness, chest pain, syncope, dyspnea on  exertion, peripheral edema, prolonged cough, headaches, hemoptysis, abdominal pain, melena, hematochezia, severe indigestion/heartburn, hematuria, incontinence, muscle weakness, suspicious skin lesions, transient blindness, difficulty walking, depression, unusual weight change, abnormal bleeding, enlarged lymph nodes, and angioedema.     Objective:   Physical Exam      WD, WN, mesomorphic, 69 y/o BM in NAD... GENERAL:  Alert & oriented; pleasant & cooperative... HEENT:  [place]/AT, EOM-wnl, PERRLA, Fundi-benign, EACs-clear, TMs-wnl, NOSE-clear, THROAT-clear & wnl. NECK:  Supple w/ full ROM; no JVD; normal carotid impulses w/o bruits; no thyromegaly or nodules palpated; no lymphadenopathy. CHEST:  Clear to P & A; without wheezes/ rales/ or rhonchi heard... HEART:  Regular Rhythm; without murmurs/ rubs/ or gallops detected... ABDOMEN:  Soft & nontender; normal bowel sounds; no organomegaly or masses palpated... EXT: without deformities, mild arthritic changes; no varicose veins/ venous insuffic/ or edema. NEURO:  CN's intact; motor testing normal; sensory testing normal; gait normal & balance OK. DERM:  No lesions noted; no rash etc...   Assessment & Plan:   Hyperlipidemia>  Doing well on the Simva40 + Fish Oil supplements;  FLP looks good, tol med well> continue same + diet/ exercise...  GERD>  He has Nexium 40mg /d but clearly only using this Prn based on med bottle & refills...  Divertic/ Polyps>  He is up to date on screening colon w/ f/u due 2016...  Prostate>  Followed by DrBorden & improving s/p XRT for his prostate ca... Currently on Vesicare for his bladder symptoms...  DJD>  Stable on his exercise program + osteobiflex & OTC anti-inflamm Rx  Anxiety/  Somatization>  He has Valium to use prn but doesn't take it very often> encouraged to use more regularly.Marland KitchenMarland Kitchen

## 2010-08-08 NOTE — Patient Instructions (Signed)
Today we updated your med list in our new EPIC system...    Continue your current meds the same 7 remember to take them every day!  Today we did your follow up fasting blood work...    Please call the PHONE TREE in a few days for your results...    Dial [id number] & when prompted enter your patient number followed by the # symbol...    Your patient number is:  [phone]  Call for any questions... Let's plan another follow up visit in 6 months.Marland KitchenMarland Kitchen

## 2010-09-02 NOTE — Op Note (Signed)
[facility]  Patient:    Brett Terrell, Brett Terrell                       MRN: [id number] Proc. Date: 04/30/00 Adm. Date:  [id number] Attending:  Ollen Gross V                           Operative Report  PREOPERATIVE DIAGNOSIS:  Right knee medial meniscal tear.  POSTOPERATIVE DIAGNOSIS:  Right knee medial meniscal tear.  PROCEDURE:  Right knee arthroscopy with meniscal debridement and chondroplasty of medial femoral condyle.  SURGEON:  Ollen Gross, M.D.  ASSISTANT:  None.  ANESTHESIA:  Local with MAC.  ESTIMATED BLOOD LOSS:  Minimal.  DRAINS:  None.  COMPLICATIONS:  None.  CONDITION:  Stable to recovery.  BRIEF CLINICAL NOTE:  Brett Terrell is a 70 year old male who had an on the job injury approximately seven months ago in which he tore the medial meniscus of his right knee.  He has had progressive pain and mechanical symptoms and presents now for meniscal debridement.  PROCEDURE IN DETAIL:  After successful administration of local MAC anesthetic, the tourniquet was placed high on the right thigh and the right lower extremity was prepped and draped in the usual sterile fashion.  Standard superomedial incisions were made.  The skin was cut starting with an 11 blade and the inflow cannula was passed into the joint.  Inflow was initiated.  An inferolateral incision was made and the cannula with the camera was placed. The camera was then passed in through the joint.  ______ was performed and arthroscopic visualization initiated.  The suprapatellar region looked normal.  The surface of the patella had some grade 1 changes.  The trochlear had some grade 3-4 changes down at the inferior aspect of the trochlea, otherwise it looked normal.  The medial and lateral gutters were visualized and looked normal.  The flexion and valgus were supplied to the knee and the medial compartment was entered.  The anterior aspect and the body of the medial meniscus looked  normal.  It had grade 2 and 3 chondromalacia in the weightbearing portion of the medial femoral condyle.  The spinal needle was used to localize the inferomedial portal.  A small incision was made and a dilator was placed.  A probe was then placed down into the posterior horn of the meniscus and he had an unstable degenerative tear throughout the posterior horn and part of the body.  This was debrided back to a stable base with a combination of baskets and a 4.2 shaver.  He also had a lot of loose, friable cartilage along the medial femoral condyle and that was debrided back to a stable cartilaginous base and then probed.  There was no evidence of unstable cartilage.  The intrachondral notch was then visualized and ACL appeared and probed normally.  The lateral compartment also looked normal.  The patellofemoral compartments were then revisualized and the area on the trochlea had some grade 3 and 4 changes probed.  There was some unstable cartilage at the edge, which was debrided back to a stable base.  The arthroscopic equipment was then removed from the inferior portals, which were closed with interrupted 4-0 nylon.  Marcaine 0.25% 20 cc with epinephrine was injected through the inflow cannula, which was removed, and that portal was closed with nylon.  Bulky sterile dressings applied.  The patient was awaken and transported to recovery  in stable condition. DD:  04/30/00 TD:  04/30/00 Job: [id number]

## 2010-09-26 ENCOUNTER — Telehealth: Payer: Self-pay | Admitting: Pulmonary Disease

## 2010-09-26 NOTE — Telephone Encounter (Signed)
Called and spoke with pt.  Pt c/o having night sweats x 2 weeks.  "some days are worse than others"  Pt states the only changes he has had recently was a prescription from urology = Vesicare 5mg  daily and a med from his dentist (pt cant remember the name) which he finished taking ( was on this d/t dental work for an implant) Pt is requesting SN's recs.  Please advise.

## 2010-09-26 NOTE — Telephone Encounter (Signed)
Per SN: tylenol 2 tabs by mouth qhs, plenty of fluids, if no better needs ov.  Called spoke with patient, advised of SN's recs as stated above.  Pt verbalized his understanding.

## 2010-09-26 NOTE — Telephone Encounter (Signed)
Called and spoke with pt.  Pt c/o having night sweats x 2 weeks.  "some days are worse than others."  Pt denies any changes to shidl

## 2010-09-27 ENCOUNTER — Telehealth: Payer: Self-pay | Admitting: Pulmonary Disease

## 2010-09-27 NOTE — Telephone Encounter (Signed)
Spoke with pt.  He states that he tried taking 2 tylenol before bed last night and still woke up with sweats. OV sched with TP for tomorrow at 10:30 am.

## 2010-09-28 ENCOUNTER — Encounter: Payer: Self-pay | Admitting: Adult Health

## 2010-09-28 ENCOUNTER — Ambulatory Visit (INDEPENDENT_AMBULATORY_CARE_PROVIDER_SITE_OTHER): Payer: Medicare PPO | Admitting: Adult Health

## 2010-09-28 DIAGNOSIS — R61 Generalized hyperhidrosis: Secondary | ICD-10-CM

## 2010-09-28 DIAGNOSIS — R42 Dizziness and giddiness: Secondary | ICD-10-CM

## 2010-09-28 DIAGNOSIS — K219 Gastro-esophageal reflux disease without esophagitis: Secondary | ICD-10-CM

## 2010-09-28 NOTE — Progress Notes (Signed)
Subjective:    Patient ID: Brett Terrell, male    DOB: Jan 12, 1941, 70 y.o.   MRN: [id number]  HPI 70 y/o BM here for a follow up visit... he has multiple medical problems as noted below...   ~  August 10, 2009:  referred to DrBorden for Urology & PSA continued to rise up to 5.9 w/ Bx 1/11 showing AdenoCa, favorable-risk, consult from [facility] & decision to go w/ XRT - 40 treatments planned & now in progress... he has once again stopped his Simva40 (?didn't know he needed to stay on this) & we discussed this in detail > he wants to check labs first.  ~  February 07, 2010:  Back on Simva40 daily & tol well- he will ret for FLP to check response... he persists w/ mult minor somatic complaints but overall stable & he is active w/o CP, palpit, SOB, etc... he had colonoscopy 7/11 by DrStark w/ several polyps removed (tubular adenomas), melanosis & hems... also had f/u DrBorden for Urology w/ XRT completed 6/11 & f/u PSA starting to decline; he is on Flomax for LUTS & trying to wean off this med...  ~  August 08, 2010:  64mo ROV & doing satis- he has retired from [employer] starting to work-out etc;  He saw DrBorden 1/12 for f/u Prostate cancer, s/p XRT & improved, some LTOS treated w/ Vesicare;  He brought his meds in today & we reviewed> refills & pill counts are off & he is reminded to take meds regularly;  Due for fasting labs today> see below...  ~09/28/10 Acute OV  Complains of Night sweats for 10 days. Has sweating along upper tshirt, neck . No where else.  Had a new med from urology started 2 weeks. -Rapaflo . Has not associated chest pain, syncope, dizziness, jaw pain or arm pain.  No other symptoms except for night sweats. Energy level is low.    PMH   Hx of ASTHMATIC BRONCHITIS, ACUTE (ICD-466.0) - Never smoked;  rare bronchitic infections.  OBSTRUCTIVE SLEEP APNEA (ICD-327.23) - Sleep study 2/03 showed RDI 15 & mild desat 83% + loud snoring;  sleep consult DrClance 4/03 w/ attempt at  improved sleep hygiene;  later tried CPAP 8cm & intol, pt never f/u > now denies sleep disordered breathing, rest well, wakes refreshed, no daytime hypersomnolence reported...  Hx of PAC (ICD-427.61) - on ASA 81mg /d... Baseline EKG is NSR & WNL;  rare PAC's in past w/o further eval;   ~  He had GXT 1979 which was WNL ~  Nuclear Stress Test 1/97 was WNL- no ischemia & EF=65%... ~  NuclearStressTest 10/09 was negative- no infarct or ischemia, EF= 54%  HYPERLIPIDEMIA (ICD-272.4) - on Simvastatin 40mg /d + fish oil supplements... ~  FLP 12/07 showed TChol 214, TG 72, HDL42, LDL169;  pt was rec'd to start Simvistatin but prefers diet Rx. ~  FLP 7/09 showed TChol 192, TG 110, HDL 36, LDL 134... improved- continue diet efforts. ~  FLP 8/10 showed TChol 224, TG 149, HDL 43, LDL 161... rec> start Simva20, ?if he did. ~  FLP 10/10 showed TChol 224, TG 98, HDL 39, LDL 176... rec incr Simva40, ?he stopped Rx. ~  FLP off meds 4/11 showed TChol 198, TG 104, HDL 42, LDL 135... not at goal, get back on Simva40. ~  FLP 10/11 on Simva40 showed TChol 131, TG 58, HDL 41, LDL 78 ~  FLP 4/12 on Simva40 showed TChol 138, TG 82, HDL 45, LDL 77  GERD (ICD-530.81) - on NEXIUM 40mg /d... EGD 4/04 by DrSamLeB was WNL.  DIVERTICULAR DISEASE (ICD-562.10) & IBS (ICD-564.1) & COLONIC POLYPS (ICD-211.3) ~  colonoscopy 4/04 by DrSLeB was WNL& f/u 7 yrs. ~  colonoscopy 7/11 by DrStark showed several polyps (tubular adenomas), melanosis, & hems... f/u planned 39yrs.  ADENOCARCINOMA, PROSTATE (ICD-185) - referred to Urology 10/10 w/ rising PSA... seen by DrBorden w/ Bx 1/11 +for AdenoCa > he was [facility] by [facility] & decided on XRT- 40 treatments through 6/11... ~  8/11:  he had f/u DrBorden w/ PSA starting to decline s/p XRT; mult LTUS on Flomax & trying to wean off. ~  1/12:  F/u drBorden w/ PSA continuing to fall; Flomax stopped in favor of Vesicare 5mg  trial...  RENAL CYST (ICD-593.2) - Known small renal cyst on prev scan;   Urology eval by DrMcDiarmid 10/06 for nocturia & ED...  OSTEOARTHRITIS (ICD-715.90) - mod arthritis, esp knees- s/p bilat arthroscopies (Ortho evals in early 2000's by DrPaul)... he still takes Osteobiflex... ~  10/10:  c/o right hip discomfort & referred to DrAlusio for eval > he did MRI & pt reports not much found.  LOW BACK PAIN, CHRONIC (ICD-724.2) - on Diazepam 5mg  Prn muscle spasm.  NEUROPATHY, HX OF (ICD-V12.40) - Neuro eval DrSchmidt for "burning in legs" 5/03 w/ Dysesthesia (729.5);  treated w/ NEURONTIN 300mg - 2Tid & improved...  MEMORY LOSS (ICD-780.93) - subjective c/o memory loss- family doesn't notice anything... full neuro eval 1/09 by Dagoberto Reef was neg- MMSE 29/30, he was mostly distractable...  ANXIETY (ICD-300.00) - Hx signif anxiety & somatization> uses DIAZEPAM 5mg - 1/2 - 1 tab Tid Prn (for muscle spasm). ~  He is a [place] vet, retired from [employer] 2011...    Review of Systems Constitutional:   No  weight loss, + night sweats,  Neg  Fevers, chills, fatigue, or  lassitude.  HEENT:   No headaches,  Difficulty swallowing,  Tooth/dental problems, or  Sore throat,                No sneezing, itching, ear ache, nasal congestion, post nasal drip,   CV:  No chest pain,  Orthopnea, PND, swelling in lower extremities, anasarca, dizziness, palpitations, syncope.   GI  No heartburn, indigestion, abdominal pain, nausea, vomiting, diarrhea, change in bowel habits, loss of appetite, bloody stools.   Resp: No shortness of breath with exertion or at rest.  No excess mucus, no productive cough,  No non-productive cough,  No coughing up of blood.  No change in color of mucus.  No wheezing.  No chest wall deformity  Skin: no rash or lesions.  GU: no dysuria, change in color of urine, no urgency or frequency.  No flank pain, no hematuria   MS:  No joint pain or swelling.  No decreased range of motion.  No back pain.  Psych:  No change in mood or affect. No depression or anxiety.   No memory loss.         Objective:   Physical Exam GEN: A/Ox3; pleasant , NAD, well nourished   HEENT:  [place]/AT,  EACs-clear, TMs-wnl, NOSE-clear, THROAT-clear, no lesions, no postnasal drip or exudate noted.   NECK:  Supple w/ fair ROM; no JVD; normal carotid impulses w/o bruits; no thyromegaly or nodules palpated; no lymphadenopathy.  RESP  Clear  P & A; w/o, wheezes/ rales/ or rhonchi.no accessory muscle use, no dullness to percussion  CARD:  RRR, no m/r/g  , no peripheral edema, pulses intact, no cyanosis  or clubbing.  GI:   Soft & nt; nml bowel sounds; no organomegaly or masses detected.  Musco: Warm bil, no deformities or joint swelling noted.   Neuro: alert, no focal deficits noted.    Skin: Warm, no lesions or rashes        Assessment & Plan:

## 2010-09-28 NOTE — Patient Instructions (Signed)
I will call with lab work .  Make sure you are drinking enough fluids.  Please contact office for sooner follow up if symptoms do not improve or worsen or seek emergency care   follow up Dr. Kriste Basque  As planned and As needed

## 2010-09-29 ENCOUNTER — Other Ambulatory Visit (INDEPENDENT_AMBULATORY_CARE_PROVIDER_SITE_OTHER): Payer: Medicare PPO

## 2010-09-29 DIAGNOSIS — K219 Gastro-esophageal reflux disease without esophagitis: Secondary | ICD-10-CM

## 2010-09-29 DIAGNOSIS — R42 Dizziness and giddiness: Secondary | ICD-10-CM

## 2010-09-29 LAB — CBC WITH DIFFERENTIAL/PLATELET
Basophils Absolute: 0 10*3/uL (ref 0.0–0.1)
Eosinophils Absolute: 0.1 10*3/uL (ref 0.0–0.7)
Hemoglobin: 14.8 g/dL (ref 13.0–17.0)
Lymphocytes Relative: 25.2 % (ref 12.0–46.0)
MCHC: 33.8 g/dL (ref 30.0–36.0)
Monocytes Relative: 7.2 % (ref 3.0–12.0)
Neutro Abs: 4.4 10*3/uL (ref 1.4–7.7)
Neutrophils Relative %: 66.2 % (ref 43.0–77.0)
Platelets: 221 10*3/uL (ref 150.0–400.0)
RDW: 13.9 % (ref 11.5–14.6)

## 2010-09-29 LAB — BASIC METABOLIC PANEL
CO2: 29 mEq/L (ref 19–32)
Chloride: 106 mEq/L (ref 96–112)
Potassium: 3.7 mEq/L (ref 3.5–5.1)
Sodium: 143 mEq/L (ref 135–145)

## 2010-09-30 DIAGNOSIS — R61 Generalized hyperhidrosis: Secondary | ICD-10-CM | POA: Insufficient documentation

## 2010-09-30 NOTE — Assessment & Plan Note (Signed)
?   Medication side effect Will check labs   Plan:  I will call with lab work .  Make sure you are drinking enough fluids.  Please contact office for sooner follow up if symptoms do not improve or worsen or seek emergency care   follow up Dr. Kriste Basque  As planned and As needed

## 2010-10-03 ENCOUNTER — Telehealth: Payer: Self-pay | Admitting: Pulmonary Disease

## 2010-10-03 DIAGNOSIS — R61 Generalized hyperhidrosis: Secondary | ICD-10-CM

## 2010-10-03 NOTE — Telephone Encounter (Signed)
Spoke with pt and notified of results/recs per TP.  Pt verbalized understanding and will come for fasting labs in the am. Orders for labs placed in Epic.

## 2010-10-04 ENCOUNTER — Other Ambulatory Visit (INDEPENDENT_AMBULATORY_CARE_PROVIDER_SITE_OTHER): Payer: Medicare PPO

## 2010-10-04 DIAGNOSIS — Z79899 Other long term (current) drug therapy: Secondary | ICD-10-CM

## 2010-10-04 DIAGNOSIS — R61 Generalized hyperhidrosis: Secondary | ICD-10-CM

## 2010-10-04 LAB — TESTOSTERONE: Testosterone: 308.19 ng/dL — ABNORMAL LOW (ref 350.00–890.00)

## 2011-01-06 LAB — CBC
HCT: 43.6
MCHC: 34.7
MCV: 88.7
Platelets: 221
WBC: 7.7

## 2011-01-06 LAB — DIFFERENTIAL
Basophils Relative: 1
Eosinophils Absolute: 0.2
Eosinophils Relative: 2
Lymphs Abs: 2.7
Monocytes Relative: 8
Neutrophils Relative %: 54

## 2011-01-06 LAB — POCT CARDIAC MARKERS
Operator id: 5362
Troponin i, poc: <0.05

## 2011-01-06 LAB — BASIC METABOLIC PANEL
BUN: 11
CO2: 31
Chloride: 105
Creatinine, Ser: 1
Potassium: 3.9

## 2011-01-06 LAB — D-DIMER, QUANTITATIVE: D-Dimer, Quant: <0.22

## 2011-07-20 ENCOUNTER — Ambulatory Visit (INDEPENDENT_AMBULATORY_CARE_PROVIDER_SITE_OTHER)
Admission: RE | Admit: 2011-07-20 | Discharge: 2011-07-20 | Disposition: A | Discharge status: Home / Self Care | Payer: Self-pay | Source: Ambulatory Visit | Attending: Pulmonary Disease | Admitting: Pulmonary Disease

## 2011-07-20 ENCOUNTER — Encounter: Payer: Self-pay | Admitting: Pulmonary Disease

## 2011-07-20 ENCOUNTER — Ambulatory Visit (INDEPENDENT_AMBULATORY_CARE_PROVIDER_SITE_OTHER): Payer: Medicare PPO | Admitting: Pulmonary Disease

## 2011-07-20 ENCOUNTER — Other Ambulatory Visit (INDEPENDENT_AMBULATORY_CARE_PROVIDER_SITE_OTHER): Payer: Self-pay

## 2011-07-20 VITALS — BP 126/78 | HR 68 | Temp 98.4°F | Ht 70.0 in | Wt 216.4 lb

## 2011-07-20 DIAGNOSIS — C61 Malignant neoplasm of prostate: Secondary | ICD-10-CM

## 2011-07-20 DIAGNOSIS — M1990 Unspecified osteoarthritis, unspecified site: Secondary | ICD-10-CM

## 2011-07-20 DIAGNOSIS — K5730 Diverticulosis of large intestine without perforation or abscess without bleeding: Secondary | ICD-10-CM

## 2011-07-20 DIAGNOSIS — E785 Hyperlipidemia, unspecified: Secondary | ICD-10-CM

## 2011-07-20 DIAGNOSIS — D126 Benign neoplasm of colon, unspecified: Secondary | ICD-10-CM

## 2011-07-20 DIAGNOSIS — F411 Generalized anxiety disorder: Secondary | ICD-10-CM

## 2011-07-20 DIAGNOSIS — Z8669 Personal history of other diseases of the nervous system and sense organs: Secondary | ICD-10-CM

## 2011-07-20 DIAGNOSIS — K219 Gastro-esophageal reflux disease without esophagitis: Secondary | ICD-10-CM

## 2011-07-20 DIAGNOSIS — M545 Low back pain: Secondary | ICD-10-CM

## 2011-07-20 DIAGNOSIS — F419 Anxiety disorder, unspecified: Secondary | ICD-10-CM

## 2011-07-20 LAB — HEPATIC FUNCTION PANEL
Alkaline Phosphatase: 87 U/L (ref 39–117)
Bilirubin, Direct: 0.2 mg/dL (ref 0.0–0.3)
Total Protein: 7.8 g/dL (ref 6.0–8.3)

## 2011-07-20 LAB — LIPID PANEL
Cholesterol: 175 mg/dL (ref 0–200)
LDL Cholesterol: 116 mg/dL — ABNORMAL HIGH (ref 0–99)
Total CHOL/HDL Ratio: 4
Triglycerides: 71 mg/dL (ref 0.0–149.0)
VLDL: 14.2 mg/dL (ref 0.0–40.0)

## 2011-07-20 LAB — BASIC METABOLIC PANEL
CO2: 29 mEq/L (ref 19–32)
Calcium: 9.4 mg/dL (ref 8.4–10.5)
GFR: 86.84 mL/min (ref >60.00)
Potassium: 3.7 mEq/L (ref 3.5–5.1)
Sodium: 141 mEq/L (ref 135–145)

## 2011-07-20 LAB — CBC WITH DIFFERENTIAL/PLATELET
Basophils Absolute: 0 10*3/uL (ref 0.0–0.1)
Eosinophils Absolute: 0.1 10*3/uL (ref 0.0–0.7)
HCT: 47.6 % (ref 39.0–52.0)
Hemoglobin: 15.9 g/dL (ref 13.0–17.0)
Lymphs Abs: 1.6 10*3/uL (ref 0.7–4.0)
MCHC: 33.4 g/dL (ref 30.0–36.0)
Neutro Abs: 4.9 10*3/uL (ref 1.4–7.7)
RDW: 14.2 % (ref 11.5–14.6)

## 2011-07-20 NOTE — Patient Instructions (Signed)
Today we updated your med list in our EPIC system...    Continue your current medications the same...    Have your Pharm call us for any needed refills...  Today we did your follow up CXR & FASTING blood work...    We will call you w/ these results when available...  Keep up the great job w/ diet & exercise!!!  Call for any problems...  Otherwise let's plan a follow up recheck in 1 years time.Marland KitchenMarland Kitchen

## 2011-07-20 NOTE — Progress Notes (Addendum)
Subjective:    Patient ID: Brett Terrell, male    DOB: Nov 18, 1940, 71 y.o.   MRN: [id number]  HPI 71 y/o BM here for a follow up visit... he has multiple medical problems as noted below...   ~  August 10, 2009:  referred to DrBorden for Urology & PSA continued to rise up to 5.9 w/ Bx 1/11 showing AdenoCa, favorable-risk, consult from [facility] & decision to go w/ XRT - 40 treatments planned & now in progress... he has once again stopped his Simva40 (?didn't know he needed to stay on this) & we discussed this in detail > he wants to check labs first.  ~  February 07, 2010:  Back on Simva40 daily & tol well- he will ret for FLP to check response... he persists w/ mult minor somatic complaints but overall stable & he is active w/o CP, palpit, SOB, etc... he had colonoscopy 7/11 by DrStark w/ several polyps removed (tubular adenomas), melanosis & hems... also had f/u DrBorden for Urology w/ XRT completed 6/11 & f/u PSA starting to decline; he is on Flomax for LUTS & trying to wean off this med...  ~  August 08, 2010:  8mo ROV & doing satis- he has retired from [employer] starting to work-out etc;  He saw DrBorden 1/12 for f/u Prostate cancer, s/p XRT & improved, some LTOS treated w/ Vesicare;  He brought his meds in today & we reviewed> refills & pill counts are off & he is reminded to take meds regularly;  Due for fasting labs today> see below...  ~  July 20, 2011:  Yearly ROV & Obi remains stable, his biggest issue currently is dental implants that he is having done, otherw feeling well- no new complaints or concerns;  He denies CP, palpit, dizzy, SOB, edema;  BP remains normal on diet alone; Chol is ok on Simva20; GI & GU are doing satis>  SEE PROB LIST BELOW CXR 4/13 showed normal heart size, clear lungs w/ sl eleb left hemidiaph, DJD in spine... LABS 4/13:  FLP- ok on Simva20 but LDL=116;  Chems- wnl;  CBC- wnl;  TSH=0.87         Problem List:     PROBLEM LIST UPDATED 07/21/11 >>   Hx of  ASTHMATIC BRONCHITIS, ACUTE (ICD-466.0) - Never smoked;  rare bronchitic infections.  OBSTRUCTIVE SLEEP APNEA (ICD-327.23) - Sleep study 2/03 showed RDI 15 & mild desat 83% + loud snoring;  sleep consult DrClance 4/03 w/ attempt at improved sleep hygiene;  later tried CPAP 8cm & intol, pt never f/u > now denies sleep disordered breathing, rest well, wakes refreshed, no daytime hypersomnolence reported...  Hx of PAC (ICD-427.61) - on ASA 81mg /d... Baseline EKG is NSR & WNL;  rare PAC's in past w/o further eval;   ~  He had GXT 1979 which was WNL ~  Nuclear Stress Test 1/97 was WNL- no ischemia & EF=65%... ~  NuclearStressTest 10/09 was negative- no infarct or ischemia, EF= 54%  HYPERLIPIDEMIA (ICD-272.4) - on Simvastatin 40mg /d + fish oil supplements... ~  FLP 12/07 showed TChol 214, TG 72, HDL42, LDL169;  pt was rec'd to start Simvistatin but prefers diet Rx. ~  FLP 7/09 showed TChol 192, TG 110, HDL 36, LDL 134... improved- continue diet efforts. ~  FLP 8/10 showed TChol 224, TG 149, HDL 43, LDL 161... rec> start Simva20, ?if he did. ~  FLP 10/10 showed TChol 224, TG 98, HDL 39, LDL 176... rec incr Simva40, ?he stopped  Rx. ~  FLP off meds 4/11 showed TChol 198, TG 104, HDL 42, LDL 135... not at goal, get back on Simva40. ~  FLP 10/11 on Simva40 showed TChol 131, TG 58, HDL 41, LDL 78 ~  FLP 4/12 on Simva40 showed TChol 138, TG 82, HDL 45, LDL 77  GERD (ICD-530.81) - on NEXIUM 40mg /d... EGD 4/04 by DrSamLeB was WNL.  DIVERTICULAR DISEASE (ICD-562.10) & IBS (ICD-564.1) & COLONIC POLYPS (ICD-211.3) ~  colonoscopy 4/04 by DrSLeB was WNL& f/u 7 yrs. ~  colonoscopy 7/11 by DrStark showed several polyps (tubular adenomas), melanosis, & hems... f/u planned 95yrs.  ADENOCARCINOMA, PROSTATE (ICD-185) - referred to Urology 10/10 w/ rising PSA... seen by DrBorden w/ Bx 1/11 +for AdenoCa > he was seen by [facility] & decided on XRT- 40 treatments through 6/11... ~  8/11:  he had f/u DrBorden w/ PSA  starting to decline s/p XRT; mult LTUS on Flomax & trying to wean off. ~  1/12:  F/u drBorden w/ PSA continuing to fall; Flomax stopped in favor of Vesicare 5mg  trial...  RENAL CYST (ICD-593.2) - Known small renal cyst on prev scan;  Urology eval by DrMcDiarmid 10/06 for nocturia & ED...  OSTEOARTHRITIS (ICD-715.90) - mod arthritis, esp knees- s/p bilat arthroscopies (Ortho evals in early 2000's by DrPaul)... he still takes Osteobiflex... ~  10/10:  c/o right hip discomfort & referred to DrAlusio for eval > he did MRI & pt reports not much found.  LOW BACK PAIN, CHRONIC (ICD-724.2) - on Diazepam 5mg  Prn muscle spasm.  NEUROPATHY, HX OF (ICD-V12.40) - Neuro eval DrSchmidt for "burning in legs" 5/03 w/ Dysesthesia (729.5);  treated w/ NEURONTIN 300mg - 2Tid & improved...  MEMORY LOSS (ICD-780.93) - subjective c/o memory loss- family doesn't notice anything... full neuro eval 1/09 by Dagoberto Reef was neg- MMSE 29/30, he was mostly distractable...  ANXIETY (ICD-300.00) - Hx signif anxiety & somatization> uses DIAZEPAM 5mg - 1/2 - 1 tab Tid Prn (for muscle spasm). ~  He is a [place] vet, retired from [employer] 2011...   Past Surgical History  Procedure Date  . Knee arthroscopies     bilateral  . Prostate biopsy+ adenoca 2001    pt elected XRT  . Inner ear surgery 1986    Dr. Cloretta Ned    Outpatient Encounter Prescriptions as of 07/20/2011  Medication Sig Dispense Refill  . acetaminophen-codeine (TYLENOL #3) 300-30 MG per tablet Take 1 tablet by mouth every 4 (four) hours as needed.      Marland Kitchen aspirin 81 MG tablet Take 81 mg by mouth daily.       . diazepam (VALIUM) 5 MG tablet Take 5 mg by mouth. 1/2-1 tab by mouth three times a day as needed muscle spasms       . esomeprazole (NEXIUM) 40 MG capsule Take 1 capsule (40 mg total) by mouth daily.  30 capsule  11  . FLUTICASONE PROPIONATE, INHAL, IN Inhale 2 sprays into the lungs daily.      Marland Kitchen gabapentin (NEURONTIN) 300 MG capsule Take 2 tabs three  times daily as directed  180 capsule  11  . Glucosamine-Chondroitin (OSTEO BI-FLEX REGULAR STRENGTH) 250-200 MG TABS Take by mouth daily.        Marland Kitchen HYDROcodone-acetaminophen (VICODIN) 5-500 MG per tablet Take 1 tablet by mouth every 4 (four) hours as needed.        . Omega-3 350 MG CAPS Take by mouth daily.        . polyethylene glycol powder (MIRALAX) powder Take  17 g by mouth. 1 capful by mouth daily as needed       . psyllium (METAMUCIL) 58.6 % powder Take 1 packet by mouth daily as needed.        . simvastatin (ZOCOR) 20 MG tablet Take 20 mg by mouth at bedtime.        . solifenacin (VESICARE) 5 MG tablet Take 10 mg by mouth daily.        . vardenafil (LEVITRA) 20 MG tablet Take 20 mg by mouth daily as needed.      Marland Kitchen DISCONTD: amoxicillin (AMOXIL) 500 MG capsule Three times day - when remembers      . DISCONTD: silodosin (RAPAFLO) 8 MG CAPS capsule Take 8 mg by mouth daily.          No Known Allergies   Current Medications, Allergies, Past Medical History, Past Surgical History, Family History, and Social History were reviewed in [employer] record.    Review of Systems         See HPI - all other systems neg except as noted... The patient denies anorexia, fever, weight loss, weight gain, vision loss, decreased hearing, hoarseness, chest pain, syncope, dyspnea on exertion, peripheral edema, prolonged cough, headaches, hemoptysis, abdominal pain, melena, hematochezia, severe indigestion/heartburn, hematuria, incontinence, muscle weakness, suspicious skin lesions, transient blindness, difficulty walking, depression, unusual weight change, abnormal bleeding, enlarged lymph nodes, and angioedema.     Objective:   Physical Exam      WD, WN, mesomorphic, 70 y/o BM in NAD... GENERAL:  Alert & oriented; pleasant & cooperative... HEENT:  [place]/AT, EOM-wnl, PERRLA, Fundi-benign, EACs-clear, TMs-wnl, NOSE-clear, THROAT-clear & wnl. NECK:  Supple w/ full ROM; no JVD; normal  carotid impulses w/o bruits; no thyromegaly or nodules palpated; no lymphadenopathy. CHEST:  Clear to P & A; without wheezes/ rales/ or rhonchi heard... HEART:  Regular Rhythm; without murmurs/ rubs/ or gallops detected... ABDOMEN:  Soft & nontender; normal bowel sounds; no organomegaly or masses palpated... EXT: without deformities, mild arthritic changes; no varicose veins/ venous insuffic/ or edema. NEURO:  CN's intact; motor testing normal; sensory testing normal; gait normal & balance OK. DERM:  No lesions noted; no rash etc...  RADIOLOGY DATA:  Reviewed in the EPIC EMR & discussed w/ the patient...  LABORATORY DATA:  Reviewed in the EPIC EMR & discussed w/ the patient...   Assessment & Plan:   Hyperlipidemia>  Doing well on the Simva40 + Fish Oil supplements;  FLP looks good, tol med well> continue same + diet/ exercise...  GERD>  He has Nexium 40mg /d but clearly only using this Prn based on med bottle & refills...  Divertic/ Polyps>  He is up to date on screening colon w/ f/u due 2016...  Prostate>  Followed by DrBorden & improving s/p XRT for his prostate ca... Currently on Vesicare for his bladder symptoms...  DJD>  Stable on his exercise program + osteobiflex & OTC anti-inflamm Rx  Anxiety/ Somatization>  He has Valium to use prn but doesn't take it very often> encouraged to use more regularly.Marland KitchenMarland Kitchen

## 2011-10-30 ENCOUNTER — Other Ambulatory Visit: Payer: Self-pay | Admitting: Pulmonary Disease

## 2011-12-11 ENCOUNTER — Other Ambulatory Visit: Payer: Self-pay | Admitting: Pulmonary Disease

## 2012-01-02 ENCOUNTER — Ambulatory Visit (INDEPENDENT_AMBULATORY_CARE_PROVIDER_SITE_OTHER): Payer: Medicare Other

## 2012-01-02 DIAGNOSIS — Z23 Encounter for immunization: Secondary | ICD-10-CM

## 2012-02-22 ENCOUNTER — Emergency Department (HOSPITAL_COMMUNITY)
Admission: EM | Admit: 2012-02-22 | Discharge: 2012-02-22 | Discharge status: Against Medical Advice | Payer: Medicare Other | Attending: Emergency Medicine | Admitting: Emergency Medicine

## 2012-02-22 DIAGNOSIS — Z7689 Persons encountering health services in other specified circumstances: Secondary | ICD-10-CM | POA: Insufficient documentation

## 2012-02-22 NOTE — ED Notes (Signed)
Pt called without response 

## 2012-02-23 ENCOUNTER — Telehealth: Payer: Self-pay | Admitting: Pulmonary Disease

## 2012-02-23 NOTE — Telephone Encounter (Signed)
Pt returned triage's call.  Holly D Pryor ° °

## 2012-02-23 NOTE — Telephone Encounter (Signed)
ATC NA WCB no option for VM

## 2012-02-23 NOTE — Telephone Encounter (Signed)
Attempted to call pt but no answer and no machine.  Will try back later.  

## 2012-02-26 NOTE — Telephone Encounter (Signed)
ATC x 1. NA. No option to leave a msg. WCB.

## 2012-02-27 ENCOUNTER — Telehealth: Payer: Self-pay | Admitting: Pulmonary Disease

## 2012-02-27 ENCOUNTER — Encounter: Payer: Self-pay | Admitting: *Deleted

## 2012-02-27 ENCOUNTER — Ambulatory Visit: Payer: Medicare Other | Admitting: Adult Health

## 2012-02-27 NOTE — Telephone Encounter (Signed)
Called and spoke with pt and he stated that he is having a flutter/muscle spasm in his chest.  Pt stated a slight pain in the center of his chest--or about in that area.  Pt stated that he feels his heart racing when this happens.  He did go to the ER for this but was not seen.   Pt has appt with TP today at 11:45 and he will keep this appt.

## 2012-02-27 NOTE — Telephone Encounter (Signed)
ATC pt at # provided above.  Rang several times with NA and unable to leave msg.  WCB

## 2012-02-28 NOTE — Telephone Encounter (Signed)
Pt c/o constipation for 1 week.  Pt reports that he has been able to have bowel movement but it is a strain.  He has tried exlax with relief but does  not want to continue this because he says this is addictive.  Pt also does not want to take stool softener for same reason.  Pt also c/o heart fluttering off and on.  Pt went to er to have this checked but left because there were too many people.  Instructed pt that we could not treat these symptoms over the phone and we would need to see him in office .  Given appt with Tammy Parrett 02-29-12 at 10:45.

## 2012-02-29 ENCOUNTER — Ambulatory Visit (INDEPENDENT_AMBULATORY_CARE_PROVIDER_SITE_OTHER): Payer: Medicare Other | Admitting: Adult Health

## 2012-02-29 ENCOUNTER — Encounter: Payer: Self-pay | Admitting: Adult Health

## 2012-02-29 VITALS — BP 118/76 | HR 69 | Temp 98.9°F | Ht 70.5 in | Wt 215.0 lb

## 2012-02-29 DIAGNOSIS — K5900 Constipation, unspecified: Secondary | ICD-10-CM

## 2012-02-29 DIAGNOSIS — R002 Palpitations: Secondary | ICD-10-CM

## 2012-02-29 DIAGNOSIS — F411 Generalized anxiety disorder: Secondary | ICD-10-CM

## 2012-02-29 NOTE — Patient Instructions (Addendum)
We are referring you to Cardiology for your chest pressure and palpitations If chest pressure /sweating reoccurs will need to seek Emergency care.  I will call with lab results.  Restart Metamucil and Miralax daily  Gas X with meal As needed   Please contact office for sooner follow up if symptoms do not improve or worsen or seek emergency care

## 2012-02-29 NOTE — Progress Notes (Signed)
Subjective:    Patient ID: Brett Terrell, male    DOB: Jul 08, 1940, 71 y.o.   MRN: [id number]  HPI 71 y/o BM  has multiple medical problems as noted below...   ~  August 10, 2009:  referred to DrBorden for Urology & PSA continued to rise up to 5.9 w/ Bx 1/11 showing AdenoCa, favorable-risk, consult from [facility] & decision to go w/ XRT - 40 treatments planned & now in progress... he has once again stopped his Simva40 (?didn't know he needed to stay on this) & we discussed this in detail > he wants to check labs first.  ~  February 07, 2010:  Back on Simva40 daily & tol well- he will ret for FLP to check response... he persists w/ mult minor somatic complaints but overall stable & he is active w/o CP, palpit, SOB, etc... he had colonoscopy 7/11 by DrStark w/ several polyps removed (tubular adenomas), melanosis & hems... also had f/u DrBorden for Urology w/ XRT completed 6/11 & f/u PSA starting to decline; he is on Flomax for LUTS & trying to wean off this med...  ~  August 08, 2010:  2mo ROV & doing satis- he has retired from [employer] starting to work-out etc;  He saw DrBorden 1/12 for f/u Prostate cancer, s/p XRT & improved, some LTOS treated w/ Vesicare;  He brought his meds in today & we reviewed> refills & pill counts are off & he is reminded to take meds regularly;  Due for fasting labs today> see below...  ~  July 20, 2011:  Yearly ROV & Adren remains stable, his biggest issue currently is dental implants that he is having done, otherw feeling well- no new complaints or concerns;  He denies CP, palpit, dizzy, SOB, edema;  BP remains normal on diet alone; Chol is ok on Simva20; GI & GU are doing satis>  SEE PROB LIST BELOW CXR 4/13 showed normal heart size, clear lungs w/ sl eleb left hemidiaph, DJD in spine... LABS 4/13:  FLP- ok on Simva20 but LDL=116;  Chems- wnl;  CBC- wnl;  TSH=0.87  02/29/2012 Acute OV  Complains palpitations on/off for 2 weeks.  Occur mainly in afternoon watching TV.    Went to ER on 02/22/12 due to chest pressure and  diaphoresis with palpitations , left prior to being seen due to long wait. No further chest pressure since then .  But still has intermittent palpitation .  Unclear if FH of CAD.  He is a never smoker, has hyperlipidemia.  No n/v, dyspnea or edema. No exertional chest pain.  No decongestants or excessive caffeine intake.   Also complains of feeling constipated , has to strain at times Stomach growls on/off.  No longer taking miralax or metamucil .  No bloody stools.          Problem List:     PROBLEM LIST UPDATED 07/21/11 >>   Hx of ASTHMATIC BRONCHITIS, ACUTE (ICD-466.0) - Never smoked;  rare bronchitic infections.  OBSTRUCTIVE SLEEP APNEA (ICD-327.23) - Sleep study 2/03 showed RDI 15 & mild desat 83% + loud snoring;  sleep consult DrClance 4/03 w/ attempt at improved sleep hygiene;  later tried CPAP 8cm & intol, pt never f/u > now denies sleep disordered breathing, rest well, wakes refreshed, no daytime hypersomnolence reported...  Hx of PAC (ICD-427.61) - on ASA 81mg /d... Baseline EKG is NSR & WNL;  rare PAC's in past w/o further eval;   ~  He had GXT 1979 which was  WNL ~  Nuclear Stress Test 1/97 was WNL- no ischemia & EF=65%... ~  NuclearStressTest 10/09 was negative- no infarct or ischemia, EF= 54%  HYPERLIPIDEMIA (ICD-272.4) - on Simvastatin 40mg /d + fish oil supplements... ~  FLP 12/07 showed TChol 214, TG 72, HDL42, LDL169;  pt was rec'd to start Simvistatin but prefers diet Rx. ~  FLP 7/09 showed TChol 192, TG 110, HDL 36, LDL 134... improved- continue diet efforts. ~  FLP 8/10 showed TChol 224, TG 149, HDL 43, LDL 161... rec> start Simva20, ?if he did. ~  FLP 10/10 showed TChol 224, TG 98, HDL 39, LDL 176... rec incr Simva40, ?he stopped Rx. ~  FLP off meds 4/11 showed TChol 198, TG 104, HDL 42, LDL 135... not at goal, get back on Simva40. ~  FLP 10/11 on Simva40 showed TChol 131, TG 58, HDL 41, LDL 78 ~  FLP 4/12 on Simva40  showed TChol 138, TG 82, HDL 45, LDL 77  GERD (ICD-530.81) - on NEXIUM 40mg /d... EGD 4/04 by DrSamLeB was WNL.  DIVERTICULAR DISEASE (ICD-562.10) & IBS (ICD-564.1) & COLONIC POLYPS (ICD-211.3) ~  colonoscopy 4/04 by DrSLeB was WNL& f/u 7 yrs. ~  colonoscopy 7/11 by DrStark showed several polyps (tubular adenomas), melanosis, & hems... f/u planned 24yrs.  ADENOCARCINOMA, PROSTATE (ICD-185) - referred to Urology 10/10 w/ rising PSA... seen by DrBorden w/ Bx 1/11 +for AdenoCa > he was seen by [facility] & decided on XRT- 40 treatments through 6/11... ~  8/11:  he had f/u DrBorden w/ PSA starting to decline s/p XRT; mult LTUS on Flomax & trying to wean off. ~  1/12:  F/u drBorden w/ PSA continuing to fall; Flomax stopped in favor of Vesicare 5mg  trial...  RENAL CYST (ICD-593.2) - Known small renal cyst on prev scan;  Urology eval by DrMcDiarmid 10/06 for nocturia & ED...  OSTEOARTHRITIS (ICD-715.90) - mod arthritis, esp knees- s/p bilat arthroscopies (Ortho evals in early 2000's by DrPaul)... he still takes Osteobiflex... ~  10/10:  c/o right hip discomfort & referred to DrAlusio for eval > he did MRI & pt reports not much found.  LOW BACK PAIN, CHRONIC (ICD-724.2) - on Diazepam 5mg  Prn muscle spasm.  NEUROPATHY, HX OF (ICD-V12.40) - Neuro eval DrSchmidt for "burning in legs" 5/03 w/ Dysesthesia (729.5);  treated w/ NEURONTIN 300mg - 2Tid & improved...  MEMORY LOSS (ICD-780.93) - subjective c/o memory loss- family doesn't notice anything... full neuro eval 1/09 by Dagoberto Reef was neg- MMSE 29/30, he was mostly distractable...  ANXIETY (ICD-300.00) - Hx signif anxiety & somatization> uses DIAZEPAM 5mg - 1/2 - 1 tab Tid Prn (for muscle spasm). ~  He is a [place] vet, retired from [employer] 2011...   Past Surgical History  Procedure Date  . Knee arthroscopies     bilateral  . Prostate biopsy+ adenoca 2001    pt elected XRT  . Inner ear surgery 1986    Dr. Cloretta Ned    Outpatient Encounter  Prescriptions as of 02/29/2012  Medication Sig Dispense Refill  . aspirin 81 MG tablet Take 81 mg by mouth daily.       Marland Kitchen gabapentin (NEURONTIN) 300 MG capsule take 2 capsules by mouth three times a day as directed  180 capsule  11  . Glucosamine-Chondroitin (OSTEO BI-FLEX REGULAR STRENGTH) 250-200 MG TABS Take by mouth daily.        . mirabegron ER (MYRBETRIQ) 50 MG TB24 Take 50 mg by mouth daily.      Marland Kitchen NEXIUM 40 MG capsule take  1 capsule by mouth once daily  30 capsule  6  . simvastatin (ZOCOR) 20 MG tablet Take 20 mg by mouth at bedtime.        Marland Kitchen acetaminophen-codeine (TYLENOL #3) 300-30 MG per tablet Take 1 tablet by mouth every 4 (four) hours as needed.      . diazepam (VALIUM) 5 MG tablet Take 5 mg by mouth. 1/2-1 tab by mouth three times a day as needed muscle spasms       . FLUTICASONE PROPIONATE, INHAL, IN Inhale 2 sprays into the lungs daily.      Marland Kitchen HYDROcodone-acetaminophen (VICODIN) 5-500 MG per tablet Take 1 tablet by mouth every 4 (four) hours as needed.        . Omega-3 350 MG CAPS Take by mouth daily.        . polyethylene glycol powder (MIRALAX) powder Take 17 g by mouth. 1 capful by mouth daily as needed       . psyllium (METAMUCIL) 58.6 % powder Take 1 packet by mouth daily as needed.        . solifenacin (VESICARE) 5 MG tablet Take 10 mg by mouth daily.        . vardenafil (LEVITRA) 20 MG tablet Take 20 mg by mouth daily as needed.        No Known Allergies   Current Medications, Allergies, Past Medical History, Past Surgical History, Family History, and Social History were reviewed in [employer] record.    Review of Systems         Constitutional:   No  weight loss, night sweats,  Fevers, chills,  +fatigue, or  lassitude.  HEENT:   No headaches,  Difficulty swallowing,  Tooth/dental problems, or  Sore throat,                No sneezing, itching, ear ache, nasal congestion, post nasal drip,   CV:  No chest pain,  Orthopnea, PND, swelling  in lower extremities, anasarca, dizziness, palpitations, syncope.   GI  No heartburn, indigestion, abdominal pain, nausea, vomiting, diarrhea, change in bowel habits, loss of appetite, bloody stools.   Resp:  ,  No coughing up of blood.  No change in color of mucus.  No wheezing.  No chest wall deformity  Skin: no rash or lesions.  GU: no dysuria, change in color of urine, no urgency or frequency.  No flank pain, no hematuria   MS:  No joint pain or swelling.  No decreased range of motion.  No back pain.  Psych:  No change in mood or affect. No depression or anxiety.  No memory loss.       Objective:   Physical Exam      WD, WN,  70 y/o BM in NAD... GENERAL:  Alert & oriented; pleasant & cooperative... HEENT:  [place]/AT,  EACs-clear, TMs-wnl, NOSE-clear, THROAT-clear & wnl. NECK:  Supple w/ full ROM; no JVD; normal carotid impulses w/o bruits; no thyromegaly or nodules palpated; no lymphadenopathy. CHEST:  Clear to P & A; without wheezes/ rales/ or rhonchi heard... HEART:  Regular Rhythm; without murmurs/ rubs/ or gallops detected... ABDOMEN:  Soft & nontender; normal bowel sounds; no organomegaly or masses palpated... EXT: without deformities, mild arthritic changes; no varicose veins/ venous insuffic/ or edema. NEURO:  ; gait normal & balance OK. DERM:  No lesions noted; no rash etc...  CXR 07/2011 neg   Assessment & Plan:

## 2012-02-29 NOTE — Assessment & Plan Note (Signed)
Palpitations w/ associated chest pressure /diaphoresis on 02/22/2012 ? Etiology  EKG w/ no acute process, noted PACs  Advised to avoid decongestants and caffeine.  Age , hyperlipidemia and ?FH of CAD as risk factors >wil refer to cards for further evaluation  Advised if reoccurs will need ER attention.   Plan  Check tsh and bmet/cbc  Refer to cards

## 2012-02-29 NOTE — Assessment & Plan Note (Signed)
Begin miralax and metamucil  Fluids  Please contact office for sooner follow up if symptoms do not improve or worsen or seek emergency care

## 2012-03-04 ENCOUNTER — Encounter: Payer: Medicare Other | Admitting: Cardiovascular Disease

## 2012-03-04 NOTE — Progress Notes (Signed)
Patient ID: Brett Terrell, male   DOB: 1940/04/30, 71 y.o.   MRN: [id number] Erroneous

## 2012-10-04 ENCOUNTER — Emergency Department (HOSPITAL_COMMUNITY): Payer: Medicare HMO

## 2012-10-04 ENCOUNTER — Inpatient Hospital Stay (HOSPITAL_COMMUNITY)
Admission: EM | Admit: 2012-10-04 | Discharge: 2012-10-15 | DRG: 965 | Disposition: E | Payer: Medicare HMO | Attending: Emergency Medicine | Admitting: Emergency Medicine

## 2012-10-04 DIAGNOSIS — IMO0002 Reserved for concepts with insufficient information to code with codable children: Secondary | ICD-10-CM | POA: Diagnosis present

## 2012-10-04 DIAGNOSIS — S32409A Unspecified fracture of unspecified acetabulum, initial encounter for closed fracture: Secondary | ICD-10-CM | POA: Diagnosis present

## 2012-10-04 DIAGNOSIS — S2500XA Unspecified injury of thoracic aorta, initial encounter: Secondary | ICD-10-CM | POA: Diagnosis present

## 2012-10-04 DIAGNOSIS — S2249XA Multiple fractures of ribs, unspecified side, initial encounter for closed fracture: Secondary | ICD-10-CM

## 2012-10-04 DIAGNOSIS — S32509A Unspecified fracture of unspecified pubis, initial encounter for closed fracture: Secondary | ICD-10-CM | POA: Diagnosis present

## 2012-10-04 DIAGNOSIS — S3500XA Unspecified injury of abdominal aorta, initial encounter: Secondary | ICD-10-CM

## 2012-10-04 DIAGNOSIS — R4020 Unspecified coma: Secondary | ICD-10-CM | POA: Diagnosis present

## 2012-10-04 DIAGNOSIS — S279XXA Injury of unspecified intrathoracic organ, initial encounter: Secondary | ICD-10-CM

## 2012-10-04 DIAGNOSIS — Y92410 Unspecified street and highway as the place of occurrence of the external cause: Secondary | ICD-10-CM

## 2012-10-04 DIAGNOSIS — Z8546 Personal history of malignant neoplasm of prostate: Secondary | ICD-10-CM

## 2012-10-04 DIAGNOSIS — S329XXA Fracture of unspecified parts of lumbosacral spine and pelvis, initial encounter for closed fracture: Secondary | ICD-10-CM

## 2012-10-04 DIAGNOSIS — I4901 Ventricular fibrillation: Secondary | ICD-10-CM | POA: Diagnosis not present

## 2012-10-04 DIAGNOSIS — K668 Other specified disorders of peritoneum: Secondary | ICD-10-CM

## 2012-10-04 DIAGNOSIS — S272XXA Traumatic hemopneumothorax, initial encounter: Secondary | ICD-10-CM

## 2012-10-04 DIAGNOSIS — J982 Interstitial emphysema: Secondary | ICD-10-CM

## 2012-10-04 DIAGNOSIS — S065X6A Traumatic subdural hemorrhage with loss of consciousness greater than 24 hours without return to pre-existing conscious level with patient surviving, initial encounter: Principal | ICD-10-CM | POA: Diagnosis present

## 2012-10-04 DIAGNOSIS — R0902 Hypoxemia: Secondary | ICD-10-CM | POA: Diagnosis present

## 2012-10-04 DIAGNOSIS — I629 Nontraumatic intracranial hemorrhage, unspecified: Secondary | ICD-10-CM | POA: Diagnosis present

## 2012-10-04 DIAGNOSIS — S2501XA Minor laceration of thoracic aorta, initial encounter: Secondary | ICD-10-CM

## 2012-10-04 DIAGNOSIS — T794XXA Traumatic shock, initial encounter: Secondary | ICD-10-CM

## 2012-10-04 DIAGNOSIS — S2231XA Fracture of one rib, right side, initial encounter for closed fracture: Secondary | ICD-10-CM

## 2012-10-04 DIAGNOSIS — I469 Cardiac arrest, cause unspecified: Secondary | ICD-10-CM

## 2012-10-04 DIAGNOSIS — S065X9A Traumatic subdural hemorrhage with loss of consciousness of unspecified duration, initial encounter: Secondary | ICD-10-CM

## 2012-10-04 DIAGNOSIS — S271XXA Traumatic hemothorax, initial encounter: Secondary | ICD-10-CM

## 2012-10-04 DIAGNOSIS — S32401A Unspecified fracture of right acetabulum, initial encounter for closed fracture: Secondary | ICD-10-CM

## 2012-10-04 DIAGNOSIS — S065X0A Traumatic subdural hemorrhage without loss of consciousness, initial encounter: Secondary | ICD-10-CM

## 2012-10-04 DIAGNOSIS — I498 Other specified cardiac arrhythmias: Secondary | ICD-10-CM | POA: Diagnosis not present

## 2012-10-04 LAB — COMPREHENSIVE METABOLIC PANEL
Albumin: 3.3 g/dL — ABNORMAL LOW (ref 3.5–5.2)
Alkaline Phosphatase: 87 U/L (ref 39–117)
BUN: 14 mg/dL (ref 6–23)
CO2: 23 mEq/L (ref 19–32)
Chloride: 105 mEq/L (ref 96–112)
GFR calc non Af Amer: 57 mL/min — ABNORMAL LOW (ref >90)
Glucose, Bld: 200 mg/dL — ABNORMAL HIGH (ref 70–99)
Potassium: 4 mEq/L (ref 3.5–5.1)
Total Bilirubin: 0.4 mg/dL (ref 0.3–1.2)

## 2012-10-04 LAB — CBC
MCV: 86 fL (ref 78.0–100.0)
Platelets: 217 10*3/uL (ref 150–400)
RDW: 13.8 % (ref 11.5–15.5)
WBC: 12.8 10*3/uL — ABNORMAL HIGH (ref 4.0–10.5)

## 2012-10-04 LAB — POCT I-STAT, CHEM 8
Creatinine, Ser: 1.3 mg/dL (ref 0.50–1.35)
Glucose, Bld: 196 mg/dL — ABNORMAL HIGH (ref 70–99)
Hemoglobin: 12.6 g/dL — ABNORMAL LOW (ref 13.0–17.0)
Sodium: 143 mEq/L (ref 135–145)
TCO2: 24 mmol/L (ref 0–100)

## 2012-10-04 LAB — PROTIME-INR: Prothrombin Time: 15.8 seconds — ABNORMAL HIGH (ref 11.6–15.2)

## 2012-10-04 LAB — POCT I-STAT 3, ART BLOOD GAS (G3+)
Acid-base deficit: 9 mmol/L — ABNORMAL HIGH (ref 0.0–2.0)
O2 Saturation: 97 %
Patient temperature: 98.4

## 2012-10-04 MED ORDER — IOHEXOL 300 MG/ML  SOLN
100.0000 mL | Freq: Once | INTRAMUSCULAR | Status: AC | PRN
  Administered 2012-10-04: 100 mL via INTRAVENOUS

## 2012-10-04 MED ORDER — LIDOCAINE HCL (CARDIAC) 20 MG/ML IV SOLN
INTRAVENOUS | Status: AC | PRN
  Administered 2012-10-04: 100 mg via INTRAVENOUS

## 2012-10-04 MED ORDER — ROCURONIUM BROMIDE 50 MG/5ML IV SOLN
INTRAVENOUS | Status: AC
  Filled 2012-10-04: qty 2

## 2012-10-04 MED ORDER — TRANEXAMIC ACID 100 MG/ML IV SOLN
1000.0000 mg | Freq: Once | INTRAVENOUS | Status: AC
  Administered 2012-10-04: 1000 mg via INTRAVENOUS
  Filled 2012-10-04: qty 10

## 2012-10-04 MED ORDER — SUCCINYLCHOLINE CHLORIDE 20 MG/ML IJ SOLN
INTRAMUSCULAR | Status: AC
  Filled 2012-10-04: qty 1

## 2012-10-04 MED ORDER — CALCIUM CHLORIDE 10 % IV SOLN
INTRAVENOUS | Status: AC | PRN
  Administered 2012-10-04: 1 g via INTRAVENOUS

## 2012-10-04 MED ORDER — EPINEPHRINE HCL 0.1 MG/ML IJ SOSY
PREFILLED_SYRINGE | INTRAMUSCULAR | Status: AC | PRN
  Administered 2012-10-04 (×6): 1 mg via INTRAVENOUS

## 2012-10-04 MED ORDER — ETOMIDATE 2 MG/ML IV SOLN
INTRAVENOUS | Status: AC
  Filled 2012-10-04: qty 20

## 2012-10-04 MED ORDER — PROPOFOL 10 MG/ML IV EMUL
INTRAVENOUS | Status: AC
  Filled 2012-10-04: qty 100

## 2012-10-04 MED ORDER — ETOMIDATE 2 MG/ML IV SOLN
INTRAVENOUS | Status: AC | PRN
  Administered 2012-10-04: 20 mg via INTRAVENOUS

## 2012-10-04 MED ORDER — SUCCINYLCHOLINE CHLORIDE 20 MG/ML IJ SOLN
INTRAMUSCULAR | Status: AC | PRN
  Administered 2012-10-04: 150 mg via INTRAVENOUS

## 2012-10-04 MED ORDER — EPINEPHRINE HCL 0.1 MG/ML IJ SOSY
PREFILLED_SYRINGE | INTRAMUSCULAR | Status: AC
  Filled 2012-10-04: qty 40

## 2012-10-04 MED ORDER — ATROPINE SULFATE 1 MG/ML IJ SOLN
INTRAMUSCULAR | Status: AC | PRN
  Administered 2012-10-04 (×2): 1 mg via INTRAVENOUS

## 2012-10-04 MED ORDER — LIDOCAINE HCL (CARDIAC) 20 MG/ML IV SOLN
INTRAVENOUS | Status: AC
  Filled 2012-10-04: qty 5

## 2012-10-04 MED ORDER — PROPOFOL 10 MG/ML IV BOLUS
INTRAVENOUS | Status: AC | PRN
  Administered 2012-10-04: 862 ug via INTRAVENOUS

## 2012-10-05 LAB — PREPARE FRESH FROZEN PLASMA
Unit division: 0
Unit division: 0
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
## 2012-10-07 LAB — TYPE AND SCREEN
Antibody Screen: NEGATIVE
Unit division: 0
Unit division: 0
Unit division: 0
Unit division: 0

## 2012-10-14 MED FILL — Medication: Qty: 1 | Status: AC
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
## 2012-10-15 NOTE — ED Notes (Signed)
Dr. Tyrone Sage at the bedside.

## 2012-10-15 NOTE — Procedures (Signed)
Chest Tube Insertion Procedure Note  Indications:  Clinically significant Pneumothorax and Hemothorax  Pre-operative Diagnosis: right Pneumothorax and Hemothorax  Post-operative Diagnosis: right Pneumothorax and Hemothorax  Procedure Details  IEmergency consent  After sterile skin prep, using standard technique, a 28 French tube was placed in the right Anterior axillary line at the nipple level  Findings: 400cc blood and rush of air     Complications:  Remained in the trauma bay undergoing resuscitation         Condition: unstable  Brett Gelinas, MD, MPH, FACS Pager: [phone]

## 2012-10-15 NOTE — ED Notes (Addendum)
Blood unit 1 of emergency release started at this time, [id number]. Verified by Gennie Alma, RN.

## 2012-10-15 NOTE — ED Notes (Signed)
First unit finished and 2nd unit started. [id number]. Verified by Gennie Alma, RN

## 2012-10-15 NOTE — Progress Notes (Signed)
Echo Lab  2D Echocardiogram completed.  Alasia Enge L Isaack Preble, RDCS October 11, 2012 2:51 PM
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
## 2012-10-15 NOTE — ED Notes (Signed)
Lactic acid results shown to Dr. Glick 

## 2012-10-15 NOTE — ED Notes (Signed)
Pt returned from CT to Trauma B.
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
## 2012-10-15 NOTE — Procedures (Signed)
Central Venous Catheter Insertion Procedure Note Yerik Zeringue [id number] 1940/05/07  Procedure: Insertion of Central Venous Catheter Indications: Drug and/or fluid administration  Procedure Details Consent: Unable to obtain consent because of emergent medical necessity. Time Out: Verified patient identification, verified procedure, site/side was marked, verified correct patient position, special equipment/implants available, medications/allergies/relevent history reviewed, required imaging and test results available.   Maximum sterile technique was used including antiseptics, gloves and hand hygiene. Skin prep: Iodine solution; local anesthetic administered A antimicrobial bonded/coated triple lumen catheter was placed in the left femoral vein due to emergent situation using the Seldinger technique.  Evaluation Blood flow good Complications: No apparent complications Patient did tolerate procedure well.   Freeman Caldron, PA-C Pager: [phone] General Trauma PA Pager: [phone]  Nov 01, 2012, 1:20 PM

## 2012-10-15 NOTE — Progress Notes (Signed)
Patient ID: Brett Terrell, male   DOB: 20-Jan-1941, 72 y.o.   MRN: [id number] After returning from CT, the patient developed bradycardia. This was treated with atropine without significant response. He became hypotensive as well. Dr. Tyrone Sage from [facility] Was at the bedside and performed pericardiocentesis. The patient suffered a bradycardic arrest. CPR and multiple medication Dose administration Was undertaken. Left chest tube was placed with 400 cc out. Despite ongoing aggressive resuscitation attempts including cardioversion for ventricular fibrillation x3, the patient did not regain a perfusing rhythm. Time of death 77. Family notified with family member present in the trauma Bay. Violeta Gelinas, MD, MPH, FACS Pager: [phone]

## 2012-10-15 NOTE — ED Notes (Signed)
Dr. Janee Morn speaking with daughter in law via phone at this time. Family lives in [place].

## 2012-10-15 NOTE — ED Notes (Signed)
Plasma unit 1 started, verified by Gennie Alma, RN. [phone]

## 2012-10-15 NOTE — Progress Notes (Signed)
  CSW collaborated with nursing staff and Chaplain to provide family support and counseling at the bedside.   Family appreciative for assistance.   No further needs at this time,   Leron Croak, LCSWA [facility].  [phone]
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
## 2012-10-15 NOTE — ED Notes (Signed)
EKG given to Dr. Glick. Copy placed in pt chart. 
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
## 2012-10-15 NOTE — Procedures (Signed)
Brett Daddario, MD, MPH, FACS Pager: [phone]  
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
## 2012-10-15 NOTE — ED Notes (Signed)
Family at the bedside.

## 2012-10-15 DEATH — deceased
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
## 2012-10-22 NOTE — Discharge Summary (Signed)
Brett Haithcock, MD, MPH, FACS Pager: [phone]  
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
